# Patient Record
Sex: Female | Born: 2000 | Race: Black or African American | Hispanic: No | Marital: Single | Smoking: Never smoker
Health system: Southern US, Community
[De-identification: ages and names within clinical notes are randomized; demographics above are authoritative.]

## PROBLEM LIST (undated history)

## (undated) DIAGNOSIS — J45909 Unspecified asthma, uncomplicated: Secondary | ICD-10-CM

## (undated) DIAGNOSIS — F32A Depression, unspecified: Secondary | ICD-10-CM

## (undated) DIAGNOSIS — D649 Anemia, unspecified: Secondary | ICD-10-CM

## (undated) DIAGNOSIS — F329 Major depressive disorder, single episode, unspecified: Secondary | ICD-10-CM

## (undated) DIAGNOSIS — F419 Anxiety disorder, unspecified: Secondary | ICD-10-CM

## (undated) HISTORY — PX: MANDIBLE SURGERY: SHX707

## (undated) HISTORY — DX: Depression, unspecified: F32.A

## (undated) HISTORY — PX: NO PAST SURGERIES: SHX2092

## (undated) HISTORY — PX: MYRINGOTOMY: SUR874

## (undated) HISTORY — DX: Anxiety disorder, unspecified: F41.9

---
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
## 2019-01-04 DIAGNOSIS — F6381 Intermittent explosive disorder: Secondary | ICD-10-CM | POA: Diagnosis not present

## 2019-01-11 DIAGNOSIS — F6381 Intermittent explosive disorder: Secondary | ICD-10-CM | POA: Diagnosis not present

## 2019-01-18 DIAGNOSIS — F6381 Intermittent explosive disorder: Secondary | ICD-10-CM | POA: Diagnosis not present

## 2019-01-25 DIAGNOSIS — F6381 Intermittent explosive disorder: Secondary | ICD-10-CM | POA: Diagnosis not present

## 2019-02-01 DIAGNOSIS — F6381 Intermittent explosive disorder: Secondary | ICD-10-CM | POA: Diagnosis not present

## 2019-02-08 DIAGNOSIS — F6381 Intermittent explosive disorder: Secondary | ICD-10-CM | POA: Diagnosis not present

## 2019-02-15 DIAGNOSIS — F6381 Intermittent explosive disorder: Secondary | ICD-10-CM | POA: Diagnosis not present

## 2019-02-22 DIAGNOSIS — F6381 Intermittent explosive disorder: Secondary | ICD-10-CM | POA: Diagnosis not present

## 2019-03-01 DIAGNOSIS — F6381 Intermittent explosive disorder: Secondary | ICD-10-CM | POA: Diagnosis not present

## 2019-03-13 DIAGNOSIS — F6381 Intermittent explosive disorder: Secondary | ICD-10-CM | POA: Diagnosis not present

## 2019-03-20 DIAGNOSIS — F6381 Intermittent explosive disorder: Secondary | ICD-10-CM | POA: Diagnosis not present

## 2019-03-22 ENCOUNTER — Ambulatory Visit (INDEPENDENT_AMBULATORY_CARE_PROVIDER_SITE_OTHER): Payer: Medicaid Other | Admitting: Obstetrics and Gynecology

## 2019-03-22 ENCOUNTER — Encounter: Payer: Self-pay | Admitting: Obstetrics and Gynecology

## 2019-03-22 ENCOUNTER — Other Ambulatory Visit: Payer: Self-pay

## 2019-03-22 VITALS — BP 111/69 | HR 98 | Ht 68.0 in | Wt 200.0 lb

## 2019-03-22 DIAGNOSIS — N926 Irregular menstruation, unspecified: Secondary | ICD-10-CM

## 2019-03-22 DIAGNOSIS — N390 Urinary tract infection, site not specified: Secondary | ICD-10-CM

## 2019-03-22 DIAGNOSIS — Z6830 Body mass index (BMI) 30.0-30.9, adult: Secondary | ICD-10-CM | POA: Diagnosis not present

## 2019-03-22 LAB — POCT URINE PREGNANCY: Preg Test, Ur: POSITIVE — AB

## 2019-03-22 NOTE — Progress Notes (Signed)
  Subjective:     Patient ID: Gabriela Anderson, female   DOB: 10-17-2000, 18 y.o.   MRN: [id number]  HPI Here for pregnancy confirmation. Reports normal LMP 02/04/2019, giving [facility] 11/11/2019 & EGA [redacted]w[redacted]d. Never been pregnant before. H/o frequent UTI/vaginitis (last one in January). sexually active with one female partner.  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  Lives with mother.   Review of Systems  Constitutional: Positive for fatigue.  All other systems reviewed and are negative.      Objective:   Physical Exam A&Ox4 Well groomed female in no distress Blood pressure 111/69, pulse 98, height 5\' 8"  (1.727 m), weight 200 lb (90.7 kg), last menstrual period 02/04/2019. Body mass index is 30.41 kg/m.  UPT +     Assessment:     Missed menses BMI 30 H/O frequent UTI    Plan:     Congratulated on pregnancy and discussed routine prenatal care. PNV samples given. Will send urine culture each visit. Information in check out summary for her to review. RTC in 2 weeks for viability scan and nurse intake/labs, then 5 weeks for new OB PE with me.

## 2019-03-22 NOTE — Patient Instructions (Signed)
First Trimester of Pregnancy The first trimester of pregnancy is from week 1 until the end of week 13 (months 1 through 3). A week after a sperm fertilizes an egg, the egg will implant on the wall of the uterus. This embryo will begin to develop into a baby. Genes from you and your partner will form the baby. The female genes will determine whether the baby will be a boy or a girl. At 6-8 weeks, the eyes and face will be formed, and the heartbeat can be seen on ultrasound. At the end of 12 weeks, all the baby's organs will be formed. Now that you are pregnant, you will want to do everything you can to have a healthy baby. Two of the most important things are to get good prenatal care and to follow your health care provider's instructions. Prenatal care is all the medical care you receive before the baby's birth. This care will help prevent, find, and treat any problems during the pregnancy and childbirth. Body changes during your first trimester Your body goes through many changes during pregnancy. The changes vary from woman to woman.  You may gain or lose a couple of pounds at first.  You may feel sick to your stomach (nauseous) and you may throw up (vomit). If the vomiting is uncontrollable, call your health care provider.  You may tire easily.  You may develop headaches that can be relieved by medicines. All medicines should be approved by your health care provider.  You may urinate more often. Painful urination may mean you have a bladder infection.  You may develop heartburn as a result of your pregnancy.  You may develop constipation because certain hormones are causing the muscles that push stool through your intestines to slow down.  You may develop hemorrhoids or swollen veins (varicose veins).  Your breasts may begin to grow larger and become tender. Your nipples may stick out more, and the tissue that surrounds them (areola) may become darker.  Your gums may bleed and may be  sensitive to brushing and flossing.  Dark spots or blotches (chloasma, mask of pregnancy) may develop on your face. This will likely fade after the baby is born.  Your menstrual periods will stop.  You may have a loss of appetite.  You may develop cravings for certain kinds of food.  You may have changes in your emotions from day to day, such as being excited to be pregnant or being concerned that something may go wrong with the pregnancy and baby.  You may have more vivid and strange dreams.  You may have changes in your hair. These can include thickening of your hair, rapid growth, and changes in texture. Some women also have hair loss during or after pregnancy, or hair that feels dry or thin. Your hair will most likely return to normal after your baby is born. What to expect at prenatal visits During a routine prenatal visit:  You will be weighed to make sure you and the baby are growing normally.  Your blood pressure will be taken.  Your abdomen will be measured to track your baby's growth.  The fetal heartbeat will be listened to between weeks 10 and 14 of your pregnancy.  Test results from any previous visits will be discussed. Your health care provider may ask you:  How you are feeling.  If you are feeling the baby move.  If you have had any abnormal symptoms, such as leaking fluid, bleeding, severe headaches, or abdominal  cramping.  If you are using any tobacco products, including cigarettes, chewing tobacco, and electronic cigarettes.  If you have any questions. Other tests that may be performed during your first trimester include:  Blood tests to find your blood type and to check for the presence of any previous infections. The tests will also be used to check for low iron levels (anemia) and protein on red blood cells (Rh antibodies). Depending on your risk factors, or if you previously had diabetes during pregnancy, you may have tests to check for high blood sugar  that affects pregnant women (gestational diabetes).  Urine tests to check for infections, diabetes, or protein in the urine.  An ultrasound to confirm the proper growth and development of the baby.  Fetal screens for spinal cord problems (spina bifida) and Down syndrome.  HIV (human immunodeficiency virus) testing. Routine prenatal testing includes screening for HIV, unless you choose not to have this test.  You may need other tests to make sure you and the baby are doing well. Follow these instructions at home: Medicines  Follow your health care provider's instructions regarding medicine use. Specific medicines may be either safe or unsafe to take during pregnancy.  Take a prenatal vitamin that contains at least 600 micrograms (mcg) of folic acid.  If you develop constipation, try taking a stool softener if your health care provider approves. Eating and drinking   Eat a balanced diet that includes fresh fruits and vegetables, whole grains, good sources of protein such as meat, eggs, or tofu, and low-fat dairy. Your health care provider will help you determine the amount of weight gain that is right for you.  Avoid raw meat and uncooked cheese. These carry germs that can cause birth defects in the baby.  Eating four or five small meals rather than three large meals a day may help relieve nausea and vomiting. If you start to feel nauseous, eating a few soda crackers can be helpful. Drinking liquids between meals, instead of during meals, also seems to help ease nausea and vomiting.  Limit foods that are high in fat and processed sugars, such as fried and sweet foods.  To prevent constipation: ? Eat foods that are high in fiber, such as fresh fruits and vegetables, whole grains, and beans. ? Drink enough fluid to keep your urine clear or pale yellow. Activity  Exercise only as directed by your health care provider. Most women can continue their usual exercise routine during  pregnancy. Try to exercise for 30 minutes at least 5 days a week. Exercising will help you: ? Control your weight. ? Stay in shape. ? Be prepared for labor and delivery.  Experiencing pain or cramping in the lower abdomen or lower back is a good sign that you should stop exercising. Check with your health care provider before continuing with normal exercises.  Try to avoid standing for long periods of time. Move your legs often if you must stand in one place for a long time.  Avoid heavy lifting.  Wear low-heeled shoes and practice good posture.  You may continue to have sex unless your health care provider tells you not to. Relieving pain and discomfort  Wear a good support bra to relieve breast tenderness.  Take warm sitz baths to soothe any pain or discomfort caused by hemorrhoids. Use hemorrhoid cream if your health care provider approves.  Rest with your legs elevated if you have leg cramps or low back pain.  If you develop varicose veins in  your legs, wear support hose. Elevate your feet for 15 minutes, 3-4 times a day. Limit salt in your diet. Prenatal care  Schedule your prenatal visits by the twelfth week of pregnancy. They are usually scheduled monthly at first, then more often in the last 2 months before delivery.  Write down your questions. Take them to your prenatal visits.  Keep all your prenatal visits as told by your health care provider. This is important. Safety  Wear your seat belt at all times when driving.  Make a list of emergency phone numbers, including numbers for family, friends, the hospital, and police and fire departments. General instructions  Ask your health care provider for a referral to a local prenatal education class. Begin classes no later than the beginning of month 6 of your pregnancy.  Ask for help if you have counseling or nutritional needs during pregnancy. Your health care provider can offer advice or refer you to specialists for help  with various needs.  Do not use hot tubs, steam rooms, or saunas.  Do not douche or use tampons or scented sanitary pads.  Do not cross your legs for long periods of time.  Avoid cat litter boxes and soil used by cats. These carry germs that can cause birth defects in the baby and possibly loss of the fetus by miscarriage or stillbirth.  Avoid all smoking, herbs, alcohol, and medicines not prescribed by your health care provider. Chemicals in these products affect the formation and growth of the baby.  Do not use any products that contain nicotine or tobacco, such as cigarettes and e-cigarettes. If you need help quitting, ask your health care provider. You may receive counseling support and other resources to help you quit.  Schedule a dentist appointment. At home, brush your teeth with a soft toothbrush and be gentle when you floss. Contact a health care provider if:  You have dizziness.  You have mild pelvic cramps, pelvic pressure, or nagging pain in the abdominal area.  You have persistent nausea, vomiting, or diarrhea.  You have a bad smelling vaginal discharge.  You have pain when you urinate.  You notice increased swelling in your face, hands, legs, or ankles.  You are exposed to fifth disease or chickenpox.  You are exposed to [place] measles (rubella) and have never had it. Get help right away if:  You have a fever.  You are leaking fluid from your vagina.  You have spotting or bleeding from your vagina.  You have severe abdominal cramping or pain.  You have rapid weight gain or loss.  You vomit blood or material that looks like coffee grounds.  You develop a severe headache.  You have shortness of breath.  You have any kind of trauma, such as from a fall or a car accident. Summary  The first trimester of pregnancy is from week 1 until the end of week 13 (months 1 through 3).  Your body goes through many changes during pregnancy. The changes vary from  woman to woman.  You will have routine prenatal visits. During those visits, your health care provider will examine you, discuss any test results you may have, and talk with you about how you are feeling. This information is not intended to replace advice given to you by your health care provider. Make sure you discuss any questions you have with your health care provider. Document Released: 06/30/2001 Document Revised: 06/18/2017 Document Reviewed: 06/17/2016 Elsevier Patient Education  2020 [place] for  Pregnant Women While you are pregnant, your body requires additional nutrition to help support your growing baby. You also have a higher need for some vitamins and minerals, such as folic acid, calcium, iron, and vitamin D. Eating a healthy, well-balanced diet is very important for your health and your baby's health. Your need for extra calories varies for the three 45-monthsegments of your pregnancy (trimesters). For most women, it is recommended to consume:  150 extra calories a day during the first trimester.  300 extra calories a day during the second trimester.  300 extra calories a day during the third trimester. What are tips for following this plan?   Do not try to lose weight or go on a diet during pregnancy.  Limit your overall intake of foods that have "empty calories." These are foods that have little nutritional value, such as sweets, desserts, candies, and sugar-sweetened beverages.  Eat a variety of foods (especially fruits and vegetables) to get a full range of vitamins and minerals.  Take a prenatal vitamin to help meet your additional vitamin and mineral needs during pregnancy, specifically for folic acid, iron, calcium, and vitamin D.  Remember to stay active. Ask your health care provider what types of exercise and activities are safe for you.  Practice good food safety and cleanliness. Wash your hands before you eat and after you prepare raw meat.  Wash all fruits and vegetables well before peeling or eating. Taking these actions can help to prevent food-borne illnesses that can be very dangerous to your baby, such as listeriosis. Ask your health care provider for more information about listeriosis. What does 150 extra calories look like? Healthy options that provide 150 extra calories each day could be any of the following:  6-8 oz (170-230 g) of plain low-fat yogurt with  cup of berries.  1 apple with 2 teaspoons (11 g) of peanut butter.  Cut-up vegetables with  cup (60 g) of hummus.  8 oz (230 mL) or 1 cup of low-fat chocolate milk.  1 stick of string cheese with 1 medium orange.  1 peanut butter and jelly sandwich that is made with one slice of whole-wheat bread and 1 tsp (5 g) of peanut butter. For 300 extra calories, you could eat two of those healthy options each day. What is a healthy amount of weight to gain? The right amount of weight gain for you is based on your BMI before you became pregnant. If your BMI:  Was less than 18 (underweight), you should gain 28-40 lb (13-18 kg).  Was 18-24.9 (normal), you should gain 25-35 lb (11-16 kg).  Was 25-29.9 (overweight), you should gain 15-25 lb (7-11 kg).  Was 30 or greater (obese), you should gain 11-20 lb (5-9 kg). What if I am having twins or multiples? Generally, if you are carrying twins or multiples:  You may need to eat 300-600 extra calories a day.  The recommended range for total weight gain is 25-54 lb (11-25 kg), depending on your BMI before pregnancy.  Talk with your health care provider to find out about nutritional needs, weight gain, and exercise that is right for you. What foods can I eat?  Grains All grains. Choose whole grains, such as whole-wheat bread, oatmeal, or brown rice. Vegetables All vegetables. Eat a variety of colors and types of vegetables. Remember to wash your vegetables well before peeling or eating. Fruits All fruits. Eat a variety  of colors and types of fruit. Remember to wash your fruits  well before peeling or eating. Meats and other protein foods Lean meats, including chicken, [place], fish, and lean cuts of beef, veal, or pork. If you eat fish or seafood, choose options that are higher in omega-3 fatty acids and lower in mercury, such as salmon, herring, mussels, trout, sardines, pollock, shrimp, crab, and lobster. Tofu. Tempeh. Beans. Eggs. Peanut butter and other nut butters. Make sure that all meats, poultry, and eggs are cooked to food-safe temperatures or "well-done." Two or more servings of fish are recommended each week in order to get the most benefits from omega-3 fatty acids that are found in seafood. Choose fish that are lower in mercury. You can find more information online:  [place] Dairy Pasteurized milk and milk alternatives (such as almond milk). Pasteurized yogurt and pasteurized cheese. Cottage cheese. Sour cream. Beverages Water. Juices that contain 100% fruit juice or vegetable juice. Caffeine-free teas and decaffeinated coffee. Drinks that contain caffeine are okay to drink, but it is better to avoid caffeine. Keep your total caffeine intake to less than 200 mg each day (which is 12 oz or 355 mL of coffee, tea, or soda) or the limit as told by your health care provider. Fats and oils Fats and oils are okay to include in moderation. Sweets and desserts Sweets and desserts are okay to include in moderation. Seasoning and other foods All pasteurized condiments. The items listed above may not be a complete list of recommended foods and beverages. Contact your dietitian for more options. The items listed above may not be a complete list of foods and beverages [you/your child] can eat. Contact a dietitian for more information. What foods are not recommended? Vegetables Raw (unpasteurized) vegetable juices. Fruits Unpasteurized fruit juices. Meats and other protein foods Lunch meats, bologna, hot  dogs, or other deli meats. (If you must eat those meats, reheat them until they are steaming hot.) Refrigerated pat, meat spreads from a meat counter, smoked seafood that is found in the refrigerated section of a store. Raw or undercooked meats, poultry, and eggs. Raw fish, such as sushi or sashimi. Fish that have high mercury content, such as tilefish, shark, swordfish, and king mackerel. To learn more about mercury in fish, talk with your health care provider or look for online resources, such as:  [employer] Dairy Raw (unpasteurized) milk and any foods that have raw milk in them. Soft cheeses, such as feta, queso blanco, queso fresco, Brie, Camembert cheeses, blue-veined cheeses, and Panela cheese (unless it is made with pasteurized milk, which must be stated on the label). Beverages Alcohol. Sugar-sweetened beverages, such as sodas, teas, or energy drinks. Seasoning and other foods Homemade fermented foods and drinks, such as pickles, sauerkraut, or kombucha drinks. (Store-bought pasteurized versions of these are okay.) Salads that are made in a store or deli, such as ham salad, chicken salad, egg salad, tuna salad, and seafood salad. The items listed above may not be a complete list of foods and beverages to avoid. Contact your dietitian for more information. The items listed above may not be a complete list of foods and beverages [you/your child] should avoid. Contact a dietitian for more information. Where to find more information To calculate the number of calories you need based on your height, weight, and activity level, you can use an online calculator such as:  MobileTransition.ch To calculate how much weight you should gain during pregnancy, you can use an online pregnancy weight gain calculator such as:  StreamingFood.com.cy Summary  While you are pregnant, your  body requires additional nutrition to help support your growing  baby.  Eat a variety of foods, especially fruits and vegetables to get a full range of vitamins and minerals.  Practice good food safety and cleanliness. Wash your hands before you eat and after you prepare raw meat. Wash all fruits and vegetables well before peeling or eating. Taking these actions can help to prevent food-borne illnesses, such as listeriosis, that can be very dangerous to your baby.  Do not eat raw meat or fish. Do not eat fish that have high mercury content, such as tilefish, shark, swordfish, and king mackerel. Do not eat unpasteurized (raw) dairy.  Take a prenatal vitamin to help meet your additional vitamin and mineral needs during pregnancy, specifically for folic acid, iron, calcium, and vitamin D. This information is not intended to replace advice given to you by your health care provider. Make sure you discuss any questions you have with your health care provider. Document Released: 04/20/2014 Document Revised: 10/27/2018 Document Reviewed: 04/02/2017 Elsevier Patient Education  [place]. Common Medications Safe in Pregnancy  Acne:      Constipation:  Benzoyl Peroxide     Colace  Clindamycin      Dulcolax Suppository  [place]ica Erythromycin     Fibercon  Salicylic Acid      Metamucil         Miralax AVOID:        Senakot   Accutane    Cough:  Retin-A       Cough Drops  Tetracycline      Phenergan w/ Codeine if Rx  Minocycline      Robitussin (Plain & DM)  Antibiotics:     Crabs/Lice:  Ceclor       RID  Cephalosporins    AVOID:  E-Mycins      Kwell  Keflex  Macrobid/Macrodantin   Diarrhea:  Penicillin      Kao-Pectate  Zithromax      Imodium AD         PUSH FLUIDS AVOID:       Cipro     Fever:  Tetracycline      Tylenol (Regular or Extra  Minocycline       Strength)  Levaquin      Extra Strength-Do not          Exceed 8 tabs/24 hrs Caffeine:        '200mg'$ /day (equiv. To 1 cup of coffee or  approx. 3 12 oz  sodas)         Gas: Cold/Hayfever:       Gas-X  Benadryl      Mylicon  Claritin       Phazyme  **Claritin-D        Chlor-Trimeton    Headaches:  Dimetapp      ASA-Free Excedrin  Drixoral-Non-Drowsy     Cold Compress  Mucinex (Guaifenasin)     Tylenol (Regular or Extra  Sudafed/Sudafed-12 Hour     Strength)  **Sudafed PE Pseudoephedrine   Tylenol Cold & Sinus     Vicks Vapor Rub  Zyrtec  **AVOID if Problems With Blood Pressure         Heartburn: Avoid lying down for at least 1 hour after meals  Aciphex      Maalox     Rash:  Milk of Magnesia     Benadryl    Mylanta       1% Hydrocortisone Cream  Pepcid  Pepcid Complete   Sleep  Aids:  Prevacid      Ambien   Prilosec       Benadryl  Rolaids       Chamomile Tea  Tums (Limit 4/day)     Unisom  Zantac       Tylenol PM         Warm milk-add vanilla or  Hemorrhoids:       Sugar for taste  Anusol/Anusol H.C.  (RX: Analapram 2.5%)  Sugar Substitutes:  Hydrocortisone OTC     Ok in moderation  Preparation H      Tucks        Vaseline lotion applied to tissue with wiping    Herpes:     Throat:  Acyclovir      Oragel  Famvir  Valtrex     Vaccines:         Flu Shot Leg Cramps:       *Gardasil  Benadryl      Hepatitis A         Hepatitis B Nasal Spray:       Pneumovax  Saline Nasal Spray     Polio Booster         Tetanus Nausea:       Tuberculosis test or PPD  Vitamin B6 25 mg TID   AVOID:    Dramamine      *Gardasil  Emetrol       Live Poliovirus  Ginger Root 250 mg QID    MMR (measles, mumps &  High Complex Carbs @ Bedtime    rebella)  Sea Bands-Accupressure    Varicella (Chickenpox)  Unisom 1/2 tab TID     *No known complications           If received before Pain:         Known pregnancy;   Darvocet       Resume series after  Lortab        Delivery  Percocet    Yeast:   Tramadol      Femstat  Tylenol 3      Gyne-lotrimin  Ultram       Monistat  Vicodin           MISC:         All  Sunscreens           Hair Coloring/highlights          Insect Repellant's          (Including DEET)         Mystic Tans Commonly Asked Questions During Pregnancy  Cats: A parasite can be excreted in cat feces.  To avoid exposure you need to have another person empty the little box.  If you must empty the litter box you will need to wear gloves.  Wash your hands after handling your cat.  This parasite can also be found in raw or undercooked meat so this should also be avoided.  Colds, Sore Throats, Flu: Please check your medication sheet to see what you can take for symptoms.  If your symptoms are unrelieved by these medications please call the office.  Dental Work: Most any dental work Investment banker, corporate recommends is permitted.  X-rays should only be taken during the first trimester if absolutely necessary.  Your abdomen should be shielded with a lead apron during all x-rays.  Please notify your provider prior to receiving any x-rays.  Novocaine is fine; gas is not recommended.  If your dentist requires a note from  [place] prior to dental work please call the office and we will provide one for you.  Exercise: Exercise is an important part of staying healthy during your pregnancy.  You may continue most exercises you were accustomed to prior to pregnancy.  Later in your pregnancy you will most likely notice you have difficulty with activities requiring balance like riding a bicycle.  It is important that you listen to your body and avoid activities that put you at a higher risk of falling.  Adequate rest and staying well hydrated are a must!  If you have questions about the safety of specific activities ask your provider.    Exposure to Children with illness: Try to avoid obvious exposure; report any symptoms to [place] when noted,  If you have chicken pos, red measles or mumps, you should be immune to these diseases.   Please do not take any vaccines while pregnant unless you have checked with your OB  provider.  Fetal Movement: After 28 weeks we recommend you do "kick counts" twice daily.  Lie or sit down in a calm quiet environment and count your baby movements "kicks".  You should feel your baby at least 10 times per hour.  If you have not felt 10 kicks within the first hour get up, walk around and have something sweet to eat or drink then repeat for an additional hour.  If count remains less than 10 per hour notify your provider.  Fumigating: Follow your pest control agent's advice as to how long to stay out of your home.  Ventilate the area well before re-entering.  Hemorrhoids:   Most over-the-counter preparations can be used during pregnancy.  Check your medication to see what is safe to use.  It is important to use a stool softener or fiber in your diet and to drink lots of liquids.  If hemorrhoids seem to be getting worse please call the office.   Hot Tubs:  Hot tubs Jacuzzis and saunas are not recommended while pregnant.  These increase your internal body temperature and should be avoided.  Intercourse:  Sexual intercourse is safe during pregnancy as long as you are comfortable, unless otherwise advised by your provider.  Spotting may occur after intercourse; report any bright red bleeding that is heavier than spotting.  Labor:  If you know that you are in labor, please go to the hospital.  If you are unsure, please call the office and let us help you decide what to do.  Lifting, straining, etc:  If your job requires heavy lifting or straining please check with your provider for any limitations.  Generally, you should not lift items heavier than that you can lift simply with your hands and arms (no back muscles)  Painting:  Paint fumes do not harm your pregnancy, but may make you ill and should be avoided if possible.  Latex or water based paints have less odor than oils.  Use adequate ventilation while painting.  Permanents & Hair Color:  Chemicals in hair dyes are not recommended as  they cause increase hair dryness which can increase hair loss during pregnancy.  " Highlighting" and permanents are allowed.  Dye may be absorbed differently and permanents may not hold as well during pregnancy.  Sunbathing:  Use a sunscreen, as skin burns easily during pregnancy.  Drink plenty of fluids; avoid over heating.  Tanning Beds:  Because their possible side effects are still unknown, tanning beds are not recommended.  Ultrasound Scans:  Routine ultrasounds are  performed at approximately 20 weeks.  You will be able to see your baby's general anatomy an if you would like to know the gender this can usually be determined as well.  If it is questionable when you conceived you may also receive an ultrasound early in your pregnancy for dating purposes.  Otherwise ultrasound exams are not routinely performed unless there is a medical necessity.  Although you can request a scan we ask that you pay for it when conducted because insurance does not cover " patient request" scans.  Work: If your pregnancy proceeds without complications you may work until your due date, unless your physician or employer advises otherwise.  Round Ligament Pain/Pelvic Discomfort:  Sharp, shooting pains not associated with bleeding are fairly common, usually occurring in the second trimester of pregnancy.  They tend to be worse when standing up or when you remain standing for long periods of time.  These are the result of pressure of certain pelvic ligaments called "round ligaments".  Rest, Tylenol and heat seem to be the most effective relief.  As the womb and fetus grow, they rise out of the pelvis and the discomfort improves.  Please notify the office if your pain seems different than that described.  It may represent a more serious condition.

## 2019-03-24 ENCOUNTER — Other Ambulatory Visit: Payer: Self-pay | Admitting: Obstetrics and Gynecology

## 2019-03-24 LAB — URINE CULTURE

## 2019-03-24 MED ORDER — AMOXICILLIN 500 MG PO CAPS: 500.0000 mg | ORAL_CAPSULE | Freq: Three times a day (TID) | ORAL | 2 refills | Status: DC

## 2019-03-28 DIAGNOSIS — F6381 Intermittent explosive disorder: Secondary | ICD-10-CM | POA: Diagnosis not present

## 2019-04-04 DIAGNOSIS — F6381 Intermittent explosive disorder: Secondary | ICD-10-CM | POA: Diagnosis not present

## 2019-04-05 ENCOUNTER — Telehealth: Payer: Self-pay | Admitting: Obstetrics and Gynecology

## 2019-04-05 ENCOUNTER — Encounter: Payer: Self-pay | Admitting: *Deleted

## 2019-04-05 ENCOUNTER — Other Ambulatory Visit: Payer: Self-pay | Admitting: *Deleted

## 2019-04-05 MED ORDER — FLUCONAZOLE 150 MG PO TABS: 150.0000 mg | ORAL_TABLET | Freq: Once | ORAL | 1 refills | Status: AC

## 2019-04-05 NOTE — Telephone Encounter (Signed)
The patient called and stated that her prescription for a yeast infection was never sent into her pharmacy [facility] in [place]. Pt is requesting a call back. Please advise.

## 2019-04-05 NOTE — Telephone Encounter (Signed)
Sent in pts [id number]

## 2019-04-05 NOTE — Telephone Encounter (Signed)
Patient called stating she is having vaginal problems and would like to speak with someone. Thanks

## 2019-04-06 ENCOUNTER — Ambulatory Visit (INDEPENDENT_AMBULATORY_CARE_PROVIDER_SITE_OTHER): Payer: Medicaid Other

## 2019-04-06 ENCOUNTER — Ambulatory Visit (INDEPENDENT_AMBULATORY_CARE_PROVIDER_SITE_OTHER): Payer: Medicaid Other | Admitting: Obstetrics and Gynecology

## 2019-04-06 ENCOUNTER — Other Ambulatory Visit: Payer: Self-pay

## 2019-04-06 VITALS — BP 133/85 | HR 74 | Ht 68.0 in | Wt 196.4 lb

## 2019-04-06 DIAGNOSIS — Z113 Encounter for screening for infections with a predominantly sexual mode of transmission: Secondary | ICD-10-CM | POA: Diagnosis not present

## 2019-04-06 DIAGNOSIS — Z3401 Encounter for supervision of normal first pregnancy, first trimester: Secondary | ICD-10-CM | POA: Diagnosis not present

## 2019-04-06 DIAGNOSIS — N926 Irregular menstruation, unspecified: Secondary | ICD-10-CM

## 2019-04-06 DIAGNOSIS — O3481 Maternal care for other abnormalities of pelvic organs, first trimester: Secondary | ICD-10-CM

## 2019-04-06 DIAGNOSIS — Z0283 Encounter for blood-alcohol and blood-drug test: Secondary | ICD-10-CM

## 2019-04-06 DIAGNOSIS — Z3A08 8 weeks gestation of pregnancy: Secondary | ICD-10-CM

## 2019-04-06 DIAGNOSIS — R638 Other symptoms and signs concerning food and fluid intake: Secondary | ICD-10-CM

## 2019-04-06 DIAGNOSIS — N8311 Corpus luteum cyst of right ovary: Secondary | ICD-10-CM

## 2019-04-06 NOTE — Patient Instructions (Signed)
WHAT OB PATIENTS CAN EXPECT   Confirmation of pregnancy and ultrasound ordered if medically indicated-[redacted] weeks gestation  New OB (NOB) intake with nurse and New OB (NOB) labs- [redacted] weeks gestation  New OB (NOB) physical examination with provider- 11/[redacted] weeks gestation  Flu vaccine-[redacted] weeks gestation  Anatomy scan-[redacted] weeks gestation  Glucose tolerance test, blood work to test for anemia, T-dap vaccine-[redacted] weeks gestation  Vaginal swabs/cultures-STD/Group B strep-[redacted] weeks gestation  Appointments every 4 weeks until 28 weeks  Every 2 weeks from 28 weeks until 36 weeks  Weekly visits from 36 weeks until delivery  Morning Sickness  Morning sickness is when you feel sick to your stomach (nauseous) during pregnancy. You may feel sick to your stomach and throw up (vomit). You may feel sick in the morning, but you can feel this way at any time of day. Some women feel very sick to their stomach and cannot stop throwing up (hyperemesis gravidarum). Follow these instructions at home: Medicines  Take over-the-counter and prescription medicines only as told by your doctor. Do not take any medicines until you talk with your doctor about them first.  Taking multivitamins before getting pregnant can stop or lessen the harshness of morning sickness. Eating and drinking  Eat dry toast or crackers before getting out of bed.  Eat 5 or 6 small meals a day.  Eat dry and bland foods like rice and baked potatoes.  Do not eat greasy, fatty, or spicy foods.  Have someone cook for you if the smell of food causes you to feel sick or throw up.  If you feel sick to your stomach after taking prenatal vitamins, take them at night or with a snack.  Eat protein when you need a snack. Nuts, yogurt, and cheese are good choices.  Drink fluids throughout the day.  Try ginger ale made with real ginger, ginger tea made from fresh grated ginger, or ginger candies. General instructions  Do not use any products  that have nicotine or tobacco in them, such as cigarettes and e-cigarettes. If you need help quitting, ask your doctor.  Use an air purifier to keep the air in your house free of smells.  Get lots of fresh air.  Try to avoid smells that make you feel sick.  Try: ? Wearing a bracelet that is used for seasickness (acupressure wristband). ? Going to a doctor who puts thin needles into certain body points (acupuncture) to improve how you feel. Contact a doctor if:  You need medicine to feel better.  You feel dizzy or light-headed.  You are losing weight. Get help right away if:  You feel very sick to your stomach and cannot stop throwing up.  You pass out (faint).  You have very bad pain in your belly. Summary  Morning sickness is when you feel sick to your stomach (nauseous) during pregnancy.  You may feel sick in the morning, but you can feel this way at any time of day.  Making some changes to what you eat may help your symptoms go away. This information is not intended to replace advice given to you by your health care provider. Make sure you discuss any questions you have with your health care provider. Document Released: 08/13/2004 Document Revised: 06/18/2017 Document Reviewed: 08/06/2016 Elsevier Patient Education  2020 [employer]. Prenatal Care Prenatal care is health care during pregnancy. It helps you and your unborn baby (fetus) stay as healthy as possible. Prenatal care may be provided by a midwife, a  family practice health care provider, or a childbirth and pregnancy specialist (obstetrician). How does this affect me? During pregnancy, you will be closely monitored for any new conditions that might develop. To lower your risk of pregnancy complications, you and your health care provider will talk about any underlying conditions you have. How does this affect my baby? Early and consistent prenatal care increases the chance that your baby will be healthy during  pregnancy. Prenatal care lowers the risk that your baby will be:  Born early (prematurely).  Smaller than expected at birth (small for gestational age). What can I expect at the first prenatal care visit? Your first prenatal care visit will likely be the longest. You should schedule your first prenatal care visit as soon as you know that you are pregnant. Your first visit is a good time to talk about any questions or concerns you have about pregnancy. At your visit, you and your health care provider will talk about:  Your medical history, including: ? Any past pregnancies. ? Your family's medical history. ? The baby's father's medical history. ? Any long-term (chronic) health conditions you have and how you manage them. ? Any surgeries or procedures you have had. ? Any current over-the-counter or prescription medicines, herbs, or supplements you are taking.  Other factors that could pose a risk to your baby, including:  Your home setting and your stress levels, including: ? Exposure to abuse or violence. ? Household financial strain. ? Mental health conditions you have.  Your daily health habits, including diet and exercise. Your health care provider will also:  Measure your weight, height, and blood pressure.  Do a physical exam, including a pelvic and breast exam.  Perform blood tests and urine tests to check for: ? Urinary tract infection. ? Sexually transmitted infections (STIs). ? Low iron levels in your blood (anemia). ? Blood type and certain proteins on red blood cells (Rh antibodies). ? Infections and immunity to viruses, such as hepatitis B and rubella. ? HIV (human immunodeficiency virus).  Do an ultrasound to confirm your baby's growth and development and to help predict your estimated due date (EDD). This ultrasound is done with a probe that is inserted into the vagina (transvaginal ultrasound).  Discuss your options for genetic screening.  Give you information  about how to keep yourself and your baby healthy, including: ? Nutrition and taking vitamins. ? Physical activity. ? How to manage pregnancy symptoms such as nausea and vomiting (morning sickness). ? Infections and substances that may be harmful to your baby and how to avoid them. ? Food safety. ? Dental care. ? Working. ? Travel. ? Warning signs to watch for and when to call your health care provider. How often will I have prenatal care visits? After your first prenatal care visit, you will have regular visits throughout your pregnancy. The visit schedule is often as follows:  Up to week 28 of pregnancy: once every 4 weeks.  28-36 weeks: once every 2 weeks.  After 36 weeks: every week until delivery. Some women may have visits more or less often depending on any underlying health conditions and the health of the baby. Keep all follow-up and prenatal care visits as told by your health care provider. This is important. What happens during routine prenatal care visits? Your health care provider will:  Measure your weight and blood pressure.  Check for fetal heart sounds.  Measure the height of your uterus in your abdomen (fundal height). This may be measured starting  around week 20 of pregnancy.  Check the position of your baby inside your uterus.  Ask questions about your diet, sleeping patterns, and whether you can feel the baby move.  Review warning signs to watch for and signs of labor.  Ask about any pregnancy symptoms you are having and how you are dealing with them. Symptoms may include: ? Headaches. ? Nausea and vomiting. ? Vaginal discharge. ? Swelling. ? Fatigue. ? Constipation. ? Any discomfort, including back or pelvic pain. Make a list of questions to ask your health care provider at your routine visits. What tests might I have during prenatal care visits? You may have blood, urine, and imaging tests throughout your pregnancy, such as:  Urine tests to check  for glucose, protein, or signs of infection.  Glucose tests to check for a form of diabetes that can develop during pregnancy (gestational diabetes mellitus). This is usually done around week 24 of pregnancy.  An ultrasound to check your baby's growth and development and to check for birth defects. This is usually done around week 20 of pregnancy.  A test to check for group B strep (GBS) infection. This is usually done around week 36 of pregnancy.  Genetic testing. This may include blood or imaging tests, such as an ultrasound. Some genetic tests are done during the first trimester and some are done during the second trimester. What else can I expect during prenatal care visits? Your health care provider may recommend getting certain vaccines during pregnancy. These may include:  A yearly flu shot (annual influenza vaccine). This is especially important if you will be pregnant during flu season.  Tdap (tetanus, diphtheria, pertussis) vaccine. Getting this vaccine during pregnancy can protect your baby from whooping cough (pertussis) after birth. This vaccine may be recommended between weeks 27 and 36 of pregnancy. Later in your pregnancy, your health care provider may give you information about:  Childbirth and breastfeeding classes.  Choosing a health care provider for your baby.  Umbilical cord banking.  Breastfeeding.  Birth control after your baby is born.  The hospital labor and delivery unit and how to tour it.  Registering at the hospital before you go into labor. Where to find more information  Office on Women's Health: LegalWarrants.gl  American Pregnancy Association: americanpregnancy.org  March of Dimes: marchofdimes.org Summary  Prenatal care helps you and your baby stay as healthy as possible during pregnancy.  Your first prenatal care visit will most likely be the longest.  You will have visits and tests throughout your pregnancy to monitor your health and  your baby's health.  Bring a list of questions to your visits to ask your health care provider.  Make sure to keep all follow-up and prenatal care visits with your health care provider. This information is not intended to replace advice given to you by your health care provider. Make sure you discuss any questions you have with your health care provider. Document Released: 07/09/2003 Document Revised: 10/26/2018 Document Reviewed: 07/05/2017 Elsevier Patient Education  2020 [employer]. How a Baby Grows During Pregnancy  Pregnancy begins when a female's sperm enters a female's egg (fertilization). Fertilization usually happens in one of the tubes (fallopian tubes) that connect the ovaries to the womb (uterus). The fertilized egg moves down the fallopian tube to the uterus. Once it reaches the uterus, it implants into the lining of the uterus and begins to grow. For the first 10 weeks, the fertilized egg is called an embryo. After 10 weeks, it  is called a fetus. As the fetus continues to grow, it receives oxygen and nutrients through tissue (placenta) that grows to support the developing baby. The placenta is the life support system for the baby. It provides oxygen and nutrition and removes waste. Learning as much as you can about your pregnancy and how your baby is developing can help you enjoy the experience. It can also make you aware of when there might be a problem and when to ask questions. How long does a typical pregnancy last? A pregnancy usually lasts 280 days, or about 40 weeks. Pregnancy is divided into three periods of growth, also called trimesters:  First trimester: 0-12 weeks.  Second trimester: 13-27 weeks.  Third trimester: 28-40 weeks. The day when your baby is ready to be born (full term) is your estimated date of delivery. How does my baby develop month by month? First month  The fertilized egg attaches to the inside of the uterus.  Some cells will form the placenta.  Others will form the fetus.  The arms, legs, brain, spinal cord, lungs, and heart begin to develop.  At the end of the first month, the heart begins to beat. Second month  The bones, inner ear, eyelids, hands, and feet form.  The genitals develop.  By the end of 8 weeks, all major organs are developing. Third month  All of the internal organs are forming.  Teeth develop below the gums.  Bones and muscles begin to grow. The spine can flex.  The skin is transparent.  Fingernails and toenails begin to form.  Arms and legs continue to grow longer, and hands and feet develop.  The fetus is about 3 inches (7.6 cm) long. Fourth month  The placenta is completely formed.  The external sex organs, neck, outer ear, eyebrows, eyelids, and fingernails are formed.  The fetus can hear, swallow, and move its arms and legs.  The kidneys begin to produce urine.  The skin is covered with a white, waxy coating (vernix) and very fine hair (lanugo). Fifth month  The fetus moves around more and can be felt for the first time (quickening).  The fetus starts to sleep and wake up and may begin to suck its finger.  The nails grow to the end of the fingers.  The organ in the digestive system that makes bile (gallbladder) functions and helps to digest nutrients.  If your baby is a girl, eggs are present in her ovaries. If your baby is a boy, testicles start to move down into his scrotum. Sixth month  The lungs are formed.  The eyes open. The brain continues to develop.  Your baby has fingerprints and toe prints. Your baby's hair grows thicker.  At the end of the second trimester, the fetus is about 9 inches (22.9 cm) long. Seventh month  The fetus kicks and stretches.  The eyes are developed enough to sense changes in light.  The hands can make a grasping motion.  The fetus responds to sound. Eighth month  All organs and body systems are fully developed and  functioning.  Bones harden, and taste buds develop. The fetus may hiccup.  Certain areas of the brain are still developing. The skull remains soft. Ninth month  The fetus gains about  lb (0.23 kg) each week.  The lungs are fully developed.  Patterns of sleep develop.  The fetus's head typically moves into a head-down position (vertex) in the uterus to prepare for birth.  The fetus weighs  6-9 lb (2.72-4.08 kg) and is 19-20 inches (48.26-50.8 cm) long. What can I do to have a healthy pregnancy and help my baby develop? General instructions  Take prenatal vitamins as directed by your health care provider. These include vitamins such as folic acid, iron, calcium, and vitamin D. They are important for healthy development.  Take medicines only as directed by your health care provider. Read labels and ask a pharmacist or your health care provider whether over-the-counter medicines, supplements, and prescription drugs are safe to take during pregnancy.  Keep all follow-up visits as directed by your health care provider. This is important. Follow-up visits include prenatal care and screening tests. How do I know if my baby is developing well? At each prenatal visit, your health care provider will do several different tests to check on your health and keep track of your baby's development. These include:  Fundal height and position. ? Your health care provider will measure your growing belly from your pubic bone to the top of the uterus using a tape measure. ? Your health care provider will also feel your belly to determine your baby's position.  Heartbeat. ? An ultrasound in the first trimester can confirm pregnancy and show a heartbeat, depending on how far along you are. ? Your health care provider will check your baby's heart rate at every prenatal visit.  Second trimester ultrasound. ? This ultrasound checks your baby's development. It also may show your baby's gender. What should I  do if I have concerns about my baby's development? Always talk with your health care provider about any concerns that you may have about your pregnancy and your baby. Summary  A pregnancy usually lasts 280 days, or about 40 weeks. Pregnancy is divided into three periods of growth, also called trimesters.  Your health care provider will monitor your baby's growth and development throughout your pregnancy.  Follow your health care provider's recommendations about taking prenatal vitamins and medicines during your pregnancy.  Talk with your health care provider if you have any concerns about your pregnancy or your developing baby. This information is not intended to replace advice given to you by your health care provider. Make sure you discuss any questions you have with your health care provider. Document Released: 12/23/2007 Document Revised: 10/27/2018 Document Reviewed: 05/19/2017 Elsevier Patient Education  2020 [place] of Pregnancy  The first trimester of pregnancy is from week 1 until the end of week 13 (months 1 through 3). During this time, your baby will begin to develop inside you. At 6-8 weeks, the eyes and face are formed, and the heartbeat can be seen on ultrasound. At the end of 12 weeks, all the baby's organs are formed. Prenatal care is all the medical care you receive before the birth of your baby. Make sure you get good prenatal care and follow all of your doctor's instructions. Follow these instructions at home: Medicines  Take over-the-counter and prescription medicines only as told by your doctor. Some medicines are safe and some medicines are not safe during pregnancy.  Take a prenatal vitamin that contains at least 600 micrograms (mcg) of folic acid.  If you have trouble pooping (constipation), take medicine that will make your stool soft (stool softener) if your doctor approves. Eating and drinking   Eat regular, healthy meals.  Your doctor  will tell you the amount of weight gain that is right for you.  Avoid raw meat and uncooked cheese.  If you feel sick to  your stomach (nauseous) or throw up (vomit): ? Eat 4 or 5 small meals a day instead of 3 large meals. ? Try eating a few soda crackers. ? Drink liquids between meals instead of during meals.  To prevent constipation: ? Eat foods that are high in fiber, like fresh fruits and vegetables, whole grains, and beans. ? Drink enough fluids to keep your pee (urine) clear or pale yellow. Activity  Exercise only as told by your doctor. Stop exercising if you have cramps or pain in your lower belly (abdomen) or low back.  Do not exercise if it is too hot, too humid, or if you are in a place of great height (high altitude).  Try to avoid standing for long periods of time. Move your legs often if you must stand in one place for a long time.  Avoid heavy lifting.  Wear low-heeled shoes. Sit and stand up straight.  You can have sex unless your doctor tells you not to. Relieving pain and discomfort  Wear a good support bra if your breasts are sore.  Take warm water baths (sitz baths) to soothe pain or discomfort caused by hemorrhoids. Use hemorrhoid cream if your doctor says it is okay.  Rest with your legs raised if you have leg cramps or low back pain.  If you have puffy, bulging veins (varicose veins) in your legs: ? Wear support hose or compression stockings as told by your doctor. ? Raise (elevate) your feet for 15 minutes, 3-4 times a day. ? Limit salt in your food. Prenatal care  Schedule your prenatal visits by the twelfth week of pregnancy.  Write down your questions. Take them to your prenatal visits.  Keep all your prenatal visits as told by your doctor. This is important. Safety  Wear your seat belt at all times when driving.  Make a list of emergency phone numbers. The list should include numbers for family, friends, the hospital, and police and fire  departments. General instructions  Ask your doctor for a referral to a local prenatal class. Begin classes no later than at the start of month 6 of your pregnancy.  Ask for help if you need counseling or if you need help with nutrition. Your doctor can give you advice or tell you where to go for help.  Do not use hot tubs, steam rooms, or saunas.  Do not douche or use tampons or scented sanitary pads.  Do not cross your legs for long periods of time.  Avoid all herbs and alcohol. Avoid drugs that are not approved by your doctor.  Do not use any tobacco products, including cigarettes, chewing tobacco, and electronic cigarettes. If you need help quitting, ask your doctor. You may get counseling or other support to help you quit.  Avoid cat litter boxes and soil used by cats. These carry germs that can cause birth defects in the baby and can cause a loss of your baby (miscarriage) or stillbirth.  Visit your dentist. At home, brush your teeth with a soft toothbrush. Be gentle when you floss. Contact a doctor if:  You are dizzy.  You have mild cramps or pressure in your lower belly.  You have a nagging pain in your belly area.  You continue to feel sick to your stomach, you throw up, or you have watery poop (diarrhea).  You have a bad smelling fluid coming from your vagina.  You have pain when you pee (urinate).  You have increased puffiness (swelling) in your  face, hands, legs, or ankles. Get help right away if:  You have a fever.  You are leaking fluid from your vagina.  You have spotting or bleeding from your vagina.  You have very bad belly cramping or pain.  You gain or lose weight rapidly.  You throw up blood. It may look like coffee grounds.  You are around people who have [place] measles, fifth disease, or chickenpox.  You have a very bad headache.  You have shortness of breath.  You have any kind of trauma, such as from a fall or a car  accident. Summary  The first trimester of pregnancy is from week 1 until the end of week 13 (months 1 through 3).  To take care of yourself and your unborn baby, you will need to eat healthy meals, take medicines only if your doctor tells you to do so, and do activities that are safe for you and your baby.  Keep all follow-up visits as told by your doctor. This is important as your doctor will have to ensure that your baby is healthy and growing well. This information is not intended to replace advice given to you by your health care provider. Make sure you discuss any questions you have with your health care provider. Document Released: 12/23/2007 Document Revised: 10/27/2018 Document Reviewed: 07/14/2016 Elsevier Patient Education  2020 [employer]. Commonly Asked Questions During Pregnancy  Cats: A parasite can be excreted in cat feces.  To avoid exposure you need to have another person empty the little box.  If you must empty the litter box you will need to wear gloves.  Wash your hands after handling your cat.  This parasite can also be found in raw or undercooked meat so this should also be avoided.  Colds, Sore Throats, Flu: Please check your medication sheet to see what you can take for symptoms.  If your symptoms are unrelieved by these medications please call the office.  Dental Work: Most any dental work [employer] recommends is permitted.  X-rays should only be taken during the first trimester if absolutely necessary.  Your abdomen should be shielded with a lead apron during all x-rays.  Please notify your provider prior to receiving any x-rays.  Novocaine is fine; gas is not recommended.  If your dentist requires a note from [place] prior to dental work please call the office and we will provide one for you.  Exercise: Exercise is an important part of staying healthy during your pregnancy.  You may continue most exercises you were accustomed to prior to pregnancy.  Later in your  pregnancy you will most likely notice you have difficulty with activities requiring balance like riding a bicycle.  It is important that you listen to your body and avoid activities that put you at a higher risk of falling.  Adequate rest and staying well hydrated are a must!  If you have questions about the safety of specific activities ask your provider.    Exposure to Children with illness: Try to avoid obvious exposure; report any symptoms to [place] when noted,  If you have chicken pos, red measles or mumps, you should be immune to these diseases.   Please do not take any vaccines while pregnant unless you have checked with your OB provider.  Fetal Movement: After 28 weeks we recommend you do "kick counts" twice daily.  Lie or sit down in a calm quiet environment and count your baby movements "kicks".  You should feel your baby  should feel your baby at least 10 times per hour.  If you have not felt 10 kicks within the first hour get up, walk around and have something sweet to eat or drink then repeat for an additional hour.  If count remains less than 10 per hour notify your provider.  Fumigating: Follow your pest control agent's advice as to how long to stay out of your home.  Ventilate the area well before re-entering.  Hemorrhoids:   Most over-the-counter preparations can be used during pregnancy.  Check your medication to see what is safe to use.  It is important to use a stool softener or fiber in your diet and to drink lots of liquids.  If hemorrhoids seem to be getting worse please call the office.   Hot Tubs:  Hot tubs Jacuzzis and saunas are not recommended while pregnant.  These increase your internal body temperature and should be avoided.  Intercourse:  Sexual intercourse is safe during pregnancy as long as you are comfortable, unless otherwise advised by your provider.  Spotting may occur after intercourse; report any bright red bleeding that is heavier than spotting.  Labor:  If you know that you are in labor,  please go to the hospital.  If you are unsure, please call the office and let us help you decide what to do.  Lifting, straining, etc:  If your job requires heavy lifting or straining please check with your provider for any limitations.  Generally, you should not lift items heavier than that you can lift simply with your hands and arms (no back muscles)  Painting:  Paint fumes do not harm your pregnancy, but may make you ill and should be avoided if possible.  Latex or water based paints have less odor than oils.  Use adequate ventilation while painting.  Permanents & Hair Color:  Chemicals in hair dyes are not recommended as they cause increase hair dryness which can increase hair loss during pregnancy.  " Highlighting" and permanents are allowed.  Dye may be absorbed differently and permanents may not hold as well during pregnancy.  Sunbathing:  Use a sunscreen, as skin burns easily during pregnancy.  Drink plenty of fluids; avoid over heating.  Tanning Beds:  Because their possible side effects are still unknown, tanning beds are not recommended.  Ultrasound Scans:  Routine ultrasounds are performed at approximately 20 weeks.  You will be able to see your baby's general anatomy an if you would like to know the gender this can usually be determined as well.  If it is questionable when you conceived you may also receive an ultrasound early in your pregnancy for dating purposes.  Otherwise ultrasound exams are not routinely performed unless there is a medical necessity.  Although you can request a scan we ask that you pay for it when conducted because insurance does not cover " patient request" scans.  Work: If your pregnancy proceeds without complications you may work until your due date, unless your physician or employer advises otherwise.  Round Ligament Pain/Pelvic Discomfort:  Sharp, shooting pains not associated with bleeding are fairly common, usually occurring in the second trimester of  pregnancy.  They tend to be worse when standing up or when you remain standing for long periods of time.  These are the result of pressure of certain pelvic ligaments called "round ligaments".  Rest, Tylenol and heat seem to be the most effective relief.  As the womb and fetus grow, they rise out of the pelvis   Please notify the office if your pain seems different than that described.  It may represent a more serious condition.  Common Medications Safe in Pregnancy  Acne:      Constipation:  Benzoyl Peroxide     Colace  Clindamycin      Dulcolax Suppository  Topica Erythromycin     Fibercon  Salicylic Acid      Metamucil         Miralax AVOID:        Senakot   Accutane    Cough:  Retin-A       Cough Drops  Tetracycline      Phenergan w/ Codeine if Rx  Minocycline      Robitussin (Plain & DM)  Antibiotics:     Crabs/Lice:  Ceclor       RID  Cephalosporins    AVOID:  E-Mycins      Kwell  Keflex  Macrobid/Macrodantin   Diarrhea:  Penicillin      Kao-Pectate  Zithromax      Imodium AD         PUSH FLUIDS AVOID:       Cipro     Fever:  Tetracycline      Tylenol (Regular or Extra  Minocycline       Strength)  Levaquin      Extra Strength-Do not          Exceed 8 tabs/24 hrs Caffeine:        <258m/day (equiv. To 1 cup of coffee or  approx. 3 12 oz sodas)         Gas: Cold/Hayfever:       Gas-X  Benadryl      Mylicon  Claritin       Phazyme  **Claritin-D        Chlor-Trimeton    Headaches:  Dimetapp      ASA-Free Excedrin  Drixoral-Non-Drowsy     Cold Compress  Mucinex (Guaifenasin)     Tylenol (Regular or Extra  Sudafed/Sudafed-12 Hour     Strength)  **Sudafed PE Pseudoephedrine   Tylenol Cold & Sinus     Vicks Vapor Rub  Zyrtec  **AVOID if Problems With Blood Pressure         Heartburn: Avoid lying down for at least 1 hour after meals  Aciphex      Maalox     Rash:  Milk of Magnesia     Benadryl    Mylanta       1% Hydrocortisone  Cream  Pepcid  Pepcid Complete   Sleep Aids:  Prevacid      Ambien   Prilosec       Benadryl  Rolaids       Chamomile Tea  Tums (Limit 4/day)     Unisom  Zantac       Tylenol PM         Warm milk-add vanilla or  Hemorrhoids:       Sugar for taste  Anusol/Anusol H.C.  (RX: Analapram 2.5%)  Sugar Substitutes:  Hydrocortisone OTC     Ok in moderation  Preparation H      Tucks        Vaseline lotion applied to tissue with wiping    Herpes:     Throat:  Acyclovir      Oragel  Famvir  Valtrex     Vaccines:         Flu Shot Leg Cramps:       *Gardasil  Benadryl      Hepatitis A         Hepatitis B Nasal Spray:       Pneumovax  Saline Nasal Spray     Polio Booster         Tetanus Nausea:       Tuberculosis test or PPD  Vitamin B6 25 mg TID   AVOID:    Dramamine      *Gardasil  Emetrol       Live Poliovirus  Ginger Root 250 mg QID    MMR (measles, mumps &  High Complex Carbs @ Bedtime    rebella)  Sea Bands-Accupressure    Varicella (Chickenpox)  Unisom 1/2 tab TID     *No known complications           If received before Pain:         Known pregnancy;   Darvocet       Resume series after  Lortab        Delivery  Percocet    Yeast:   Tramadol      Femstat  Tylenol 3      Gyne-lotrimin  Ultram       Monistat  Vicodin           MISC:         All Sunscreens           Hair Coloring/highlights          Insect Repellant's          (Including DEET)         Mystic Tans

## 2019-04-06 NOTE — Progress Notes (Signed)
      Beryle Flock presents for NOB nurse intake visit. Pregnancy confirmation done at [facility], 03/22/19, with Melody Shambley.  G1.  P0.  LMP 02/04/19.  EDD 11/16/2019.  Ga [redacted]w[redacted]d. Pregnancy education material explained and given.  0 cats in the home.  NOB labs ordered. BMI greater than 30. TSH/HbgA1c ordered. Sickle cell order due to race. HIV and drug screen explained and ordered. Genetic screening discussed. Genetic testing; Unsure. Pt to discuss genetic testing with provider. PNV encouraged. Pt to follow up with provider in 4 weeks for NOB physical. [place] and FMLA forms reviewed and signed by patient.    Patient needs urine at [facility] for nurse intake labs

## 2019-04-07 LAB — HGB SOLU + RFLX FRAC: Sickle Solubility Test - HGBRFX: NEGATIVE

## 2019-04-07 LAB — ANTIBODY SCREEN: Antibody Screen: NEGATIVE

## 2019-04-07 LAB — HEPATITIS B SURFACE ANTIGEN: Hepatitis B Surface Ag: NEGATIVE

## 2019-04-07 LAB — HEMOGLOBIN A1C
Est. average glucose Bld gHb Est-mCnc: 111 mg/dL
Hgb A1c MFr Bld: 5.5 % (ref 4.8–5.6)

## 2019-04-07 LAB — TSH: TSH: 0.614 u[IU]/mL (ref 0.450–4.500)

## 2019-04-07 LAB — HIV ANTIBODY (ROUTINE TESTING W REFLEX): HIV Screen 4th Generation wRfx: NONREACTIVE

## 2019-04-07 LAB — RUBELLA SCREEN: Rubella Antibodies, IGG: 11.6 index (ref >0.99)

## 2019-04-07 LAB — RPR: RPR Ser Ql: NONREACTIVE

## 2019-04-07 LAB — TOXOPLASMA ANTIBODIES- IGG AND  IGM
Toxoplasma Antibody- IgM: <3 AU/mL (ref 0.0–7.9)
Toxoplasma IgG Ratio: <3 IU/mL (ref 0.0–7.1)

## 2019-04-07 LAB — ABO AND RH: Rh Factor: POSITIVE

## 2019-04-07 LAB — VARICELLA ZOSTER ANTIBODY, IGG: Varicella zoster IgG: 275 index (ref >165)

## 2019-04-10 DIAGNOSIS — F6381 Intermittent explosive disorder: Secondary | ICD-10-CM | POA: Diagnosis not present

## 2019-04-19 DIAGNOSIS — F6381 Intermittent explosive disorder: Secondary | ICD-10-CM | POA: Diagnosis not present

## 2019-04-26 ENCOUNTER — Encounter: Payer: Medicaid Other | Admitting: Obstetrics and Gynecology

## 2019-04-27 ENCOUNTER — Other Ambulatory Visit: Payer: Self-pay

## 2019-04-27 ENCOUNTER — Ambulatory Visit (INDEPENDENT_AMBULATORY_CARE_PROVIDER_SITE_OTHER): Payer: Medicaid Other | Admitting: Obstetrics and Gynecology

## 2019-04-27 ENCOUNTER — Encounter: Payer: Self-pay | Admitting: Obstetrics and Gynecology

## 2019-04-27 VITALS — BP 97/70 | HR 107 | Wt 191.3 lb

## 2019-04-27 DIAGNOSIS — Z23 Encounter for immunization: Secondary | ICD-10-CM | POA: Diagnosis not present

## 2019-04-27 DIAGNOSIS — Z113 Encounter for screening for infections with a predominantly sexual mode of transmission: Secondary | ICD-10-CM | POA: Diagnosis not present

## 2019-04-27 DIAGNOSIS — Z0283 Encounter for blood-alcohol and blood-drug test: Secondary | ICD-10-CM | POA: Diagnosis not present

## 2019-04-27 DIAGNOSIS — Z3A11 11 weeks gestation of pregnancy: Secondary | ICD-10-CM

## 2019-04-27 DIAGNOSIS — Z3491 Encounter for supervision of normal pregnancy, unspecified, first trimester: Secondary | ICD-10-CM

## 2019-04-27 DIAGNOSIS — Z3401 Encounter for supervision of normal first pregnancy, first trimester: Secondary | ICD-10-CM | POA: Diagnosis not present

## 2019-04-27 LAB — POCT URINALYSIS DIPSTICK OB
Bilirubin, UA: NEGATIVE
Blood, UA: NEGATIVE
Glucose, UA: NEGATIVE
Ketones, UA: NEGATIVE
Leukocytes, UA: NEGATIVE
Nitrite, UA: NEGATIVE
POC,PROTEIN,UA: NEGATIVE
Spec Grav, UA: 1.01 (ref 1.010–1.025)
Urobilinogen, UA: 0.2 E.U./dL
pH, UA: 7 (ref 5.0–8.0)

## 2019-04-27 NOTE — Patient Instructions (Signed)

## 2019-04-27 NOTE — Progress Notes (Signed)
Patient comes in today for new OB physical. She has no concerns. She would like to have genetic test today.

## 2019-04-27 NOTE — Progress Notes (Signed)
NEW OB HISTORY AND PHYSICAL  SUBJECTIVE:       Gabriela Anderson is a 18 y.o. G1P0 female, Patient's last menstrual period was 02/04/2019., Estimated Date of Delivery: 11/11/19, [redacted]w[redacted]d, presents today for establishment of Prenatal Care. She has no unusual complaints and complains of mild nausea      Gynecologic History Patient's last menstrual period was 02/04/2019. Normal Contraception: none  Obstetric History OB History  Gravida Para Term Preterm AB Living  1            SAB TAB Ectopic Multiple Live Births               # Outcome Date GA Lbr Len/2nd Weight Sex Delivery Anes PTL Lv  1 Current             Past Medical History:  Diagnosis Date  . Anxiety   . Asthma   . Depression     Past Surgical History:  Procedure Laterality Date  . MYRINGOTOMY      Current Outpatient Medications on File Prior to Visit  Medication Sig Dispense Refill  . amoxicillin (AMOXIL) 500 MG capsule Take 1 capsule (500 mg total) by mouth 3 (three) times daily. 21 capsule 2  . Prenatal Vit-Fe Fumarate-FA (MULTIVITAMIN-PRENATAL) 27-0.8 MG TABS tablet Take 1 tablet by mouth daily at 12 noon.     No current facility-administered medications on file prior to visit.     No Known Allergies  Social History   Socioeconomic History  . Marital status: Single    Spouse name: Not on file  . Number of children: Not on file  . Years of education: Not on file  . Highest education level: Not on file  Occupational History  . Not on file  Social Needs  . Financial resource strain: Not on file  . Food insecurity    Worry: Not on file    Inability: Not on file  . Transportation needs    Medical: Not on file    Non-medical: Not on file  Tobacco Use  . Smoking status: Never Smoker  . Smokeless tobacco: Never Used  Substance and Sexual Activity  . Alcohol use: No  . Drug use: No  . Sexual activity: Yes  Lifestyle  . Physical activity    Days per week: Not on file    Minutes per session: Not on  file  . Stress: Not on file  Relationships  . Social Herbalist on phone: Not on file    Gets together: Not on file    Attends religious service: Not on file    Active member of club or organization: Not on file    Attends meetings of clubs or organizations: Not on file    Relationship status: Not on file  . Intimate partner violence    Fear of current or ex partner: Not on file    Emotionally abused: Not on file    Physically abused: Not on file    Forced sexual activity: Not on file  Other Topics Concern  . Not on file  Social History Narrative  . Not on file    History reviewed. No pertinent family history.  The following portions of the patient's history were reviewed and updated as appropriate: allergies, current medications, past OB history, past medical history, past surgical history, past family history, past social history, and problem list.    OBJECTIVE: Initial Physical Exam (New OB)  GENERAL APPEARANCE: alert, well appearing, in no apparent  distress, oriented to person, place and time, overweight HEAD: normocephalic, atraumatic, sloight indentation in back of head- there since birth MOUTH: mucous membranes moist, pharynx normal without lesions and dental hygiene good THYROID: no thyromegaly or masses present BREASTS: no masses noted, no significant tenderness, no palpable axillary nodes, no skin changes LUNGS: clear to auscultation, no wheezes, rales or rhonchi, symmetric air entry HEART: regular rate and rhythm, no murmurs ABDOMEN: soft, nontender, nondistended, no abnormal masses, no epigastric pain and fundus not palpable EXTREMITIES: no redness or tenderness in the calves or thighs SKIN: normal coloration and turgor, no rashes LYMPH NODES: no adenopathy palpable NEUROLOGIC: alert, oriented, normal speech, no focal findings or movement disorder noted  PELVIC EXAM not indicated  ASSESSMENT: Normal pregnancy BMI 30 Needs flu  vaccine  PLAN: Prenatal care Will return tomorrow for lab draw Flu vaccine given. Monthly urine culture. See orders

## 2019-04-28 ENCOUNTER — Encounter: Payer: Self-pay | Admitting: Obstetrics and Gynecology

## 2019-04-28 ENCOUNTER — Other Ambulatory Visit: Payer: Medicaid Other

## 2019-04-28 DIAGNOSIS — Z3481 Encounter for supervision of other normal pregnancy, first trimester: Secondary | ICD-10-CM | POA: Diagnosis not present

## 2019-04-28 DIAGNOSIS — Z3491 Encounter for supervision of normal pregnancy, unspecified, first trimester: Secondary | ICD-10-CM | POA: Diagnosis not present

## 2019-04-28 LAB — URINALYSIS, ROUTINE W REFLEX MICROSCOPIC
Bilirubin, UA: NEGATIVE
Glucose, UA: NEGATIVE
Ketones, UA: NEGATIVE
Leukocytes,UA: NEGATIVE
Nitrite, UA: NEGATIVE
RBC, UA: NEGATIVE
Specific Gravity, UA: 1.027 (ref 1.005–1.030)
Urobilinogen, Ur: 1 mg/dL (ref 0.2–1.0)
pH, UA: 7 (ref 5.0–7.5)

## 2019-04-28 LAB — MICROSCOPIC EXAMINATION
Casts: NONE SEEN /lpf
Epithelial Cells (non renal): >10 /hpf — AB (ref 0–10)

## 2019-04-29 LAB — CBC WITH DIFFERENTIAL/PLATELET
Basophils Absolute: 0 10*3/uL (ref 0.0–0.2)
Basos: 1 %
EOS (ABSOLUTE): 0.1 10*3/uL (ref 0.0–0.4)
Eos: 1 %
Hematocrit: 32.5 % — ABNORMAL LOW (ref 34.0–46.6)
Hemoglobin: 10.5 g/dL — ABNORMAL LOW (ref 11.1–15.9)
Immature Grans (Abs): 0 10*3/uL (ref 0.0–0.1)
Immature Granulocytes: 0 %
Lymphocytes Absolute: 2.9 10*3/uL (ref 0.7–3.1)
Lymphs: 48 %
MCH: 27.9 pg (ref 26.6–33.0)
MCHC: 32.3 g/dL (ref 31.5–35.7)
MCV: 86 fL (ref 79–97)
Monocytes Absolute: 0.6 10*3/uL (ref 0.1–0.9)
Monocytes: 10 %
Neutrophils Absolute: 2.4 10*3/uL (ref 1.4–7.0)
Neutrophils: 40 %
Platelets: 227 10*3/uL (ref 150–450)
RBC: 3.76 x10E6/uL — ABNORMAL LOW (ref 3.77–5.28)
RDW: 13.9 % (ref 11.7–15.4)
WBC: 6 10*3/uL (ref 3.4–10.8)

## 2019-04-29 LAB — URINE CULTURE, OB REFLEX

## 2019-04-29 LAB — GC/CHLAMYDIA PROBE AMP
Chlamydia trachomatis, NAA: NEGATIVE
Neisseria Gonorrhoeae by PCR: NEGATIVE

## 2019-04-29 LAB — CULTURE, OB URINE

## 2019-05-01 LAB — DRUG PROFILE, UR, 9 DRUGS (LABCORP)
Amphetamines, Urine: NEGATIVE ng/mL
Barbiturate Quant, Ur: NEGATIVE ng/mL
Benzodiazepine Quant, Ur: NEGATIVE ng/mL
Cannabinoid Quant, Ur: POSITIVE — AB
Cocaine (Metab.): NEGATIVE ng/mL
Methadone Screen, Urine: NEGATIVE ng/mL
Opiate Quant, Ur: NEGATIVE ng/mL
PCP Quant, Ur: NEGATIVE ng/mL
Propoxyphene: NEGATIVE ng/mL

## 2019-05-01 LAB — NICOTINE SCREEN, URINE: Cotinine Ql Scrn, Ur: NEGATIVE ng/mL

## 2019-05-03 DIAGNOSIS — F6381 Intermittent explosive disorder: Secondary | ICD-10-CM | POA: Diagnosis not present

## 2019-05-10 ENCOUNTER — Telehealth: Payer: Self-pay

## 2019-05-10 NOTE — Telephone Encounter (Signed)
Attempted to contact patient re: Panorama results. Recording states " the person you are trying to reach is not accepting calls at this time."

## 2019-05-17 DIAGNOSIS — F6381 Intermittent explosive disorder: Secondary | ICD-10-CM | POA: Diagnosis not present

## 2019-05-24 DIAGNOSIS — F6381 Intermittent explosive disorder: Secondary | ICD-10-CM | POA: Diagnosis not present

## 2019-05-26 ENCOUNTER — Ambulatory Visit (INDEPENDENT_AMBULATORY_CARE_PROVIDER_SITE_OTHER): Payer: Medicaid Other | Admitting: Certified Nurse Midwife

## 2019-05-26 ENCOUNTER — Other Ambulatory Visit: Payer: Medicaid Other

## 2019-05-26 ENCOUNTER — Other Ambulatory Visit: Payer: Self-pay

## 2019-05-26 VITALS — BP 101/71 | HR 98 | Wt 187.1 lb

## 2019-05-26 DIAGNOSIS — Z3A16 16 weeks gestation of pregnancy: Secondary | ICD-10-CM

## 2019-05-26 DIAGNOSIS — Z3491 Encounter for supervision of normal pregnancy, unspecified, first trimester: Secondary | ICD-10-CM

## 2019-05-26 DIAGNOSIS — Z3689 Encounter for other specified antenatal screening: Secondary | ICD-10-CM

## 2019-05-26 DIAGNOSIS — Z3492 Encounter for supervision of normal pregnancy, unspecified, second trimester: Secondary | ICD-10-CM

## 2019-05-26 DIAGNOSIS — N390 Urinary tract infection, site not specified: Secondary | ICD-10-CM | POA: Diagnosis not present

## 2019-05-26 LAB — POCT URINALYSIS DIPSTICK OB
Bilirubin, UA: NEGATIVE
Blood, UA: NEGATIVE
Glucose, UA: NEGATIVE
Leukocytes, UA: NEGATIVE
Nitrite, UA: NEGATIVE
Spec Grav, UA: >=1.03 — AB (ref 1.010–1.025)
Urobilinogen, UA: 0.2 E.U./dL
pH, UA: 6 (ref 5.0–8.0)

## 2019-05-26 NOTE — Progress Notes (Signed)
ROB-Notes nausea without vomiting until today. Unable to tolerate glucose test, alternative list given-will repeat next visit. Genetic screening results provided in envelope for gender reveal. Urine culture today due to history frequent UTI, see orders. Anticipatory guidance regarding course of prenatal care. Reviewed red flag symptoms and when to call. RTC x 4 weeks for ANATOMY SCAN and ROB or sooner if needed.

## 2019-05-26 NOTE — Addendum Note (Signed)
Addended by: Cherre Huger on: 05/26/2019 05:08 PM   Modules accepted: Orders

## 2019-05-26 NOTE — Patient Instructions (Addendum)
Round Ligament Pain  The round ligament is a cord of muscle and tissue that helps support the uterus. It can become a source of pain during pregnancy if it becomes stretched or twisted as the baby grows. The pain usually begins in the second trimester (13-28 weeks) of pregnancy, and it can come and go until the baby is delivered. It is not a serious problem, and it does not cause harm to the baby. Round ligament pain is usually a short, sharp, and pinching pain, but it can also be a dull, lingering, and aching pain. The pain is felt in the lower side of the abdomen or in the groin. It usually starts deep in the groin and moves up to the outside of the hip area. The pain may occur when you:  Suddenly change position, such as quickly going from a sitting to standing position.  Roll over in bed.  Cough or sneeze.  Do physical activity. Follow these instructions at home:   Watch your condition for any changes.  When the pain starts, relax. Then try any of these methods to help with the pain: ? Sitting down. ? Flexing your knees up to your abdomen. ? Lying on your side with one pillow under your abdomen and another pillow between your legs. ? Sitting in a warm bath for 15-20 minutes or until the pain goes away.  Take over-the-counter and prescription medicines only as told by your health care provider.  Move slowly when you sit down or stand up.  Avoid long walks if they cause pain.  Stop or reduce your physical activities if they cause pain.  Keep all follow-up visits as told by your health care provider. This is important. Contact a health care provider if:  Your pain does not go away with treatment.  You feel pain in your back that you did not have before.  Your medicine is not helping. Get help right away if:  You have a fever or chills.  You develop uterine contractions.  You have vaginal bleeding.  You have nausea or vomiting.  You have diarrhea.  You have pain  when you urinate. Summary  Round ligament pain is felt in the lower abdomen or groin. It is usually a short, sharp, and pinching pain. It can also be a dull, lingering, and aching pain.  This pain usually begins in the second trimester (13-28 weeks). It occurs because the uterus is stretching with the growing baby, and it is not harmful to the baby.  You may notice the pain when you suddenly change position, when you cough or sneeze, or during physical activity.  Relaxing, flexing your knees to your abdomen, lying on one side, or taking a warm bath may help to get rid of the pain.  Get help from your health care provider if the pain does not go away or if you have vaginal bleeding, nausea, vomiting, diarrhea, or painful urination. This information is not intended to replace advice given to you by your health care provider. Make sure you discuss any questions you have with your health care provider. Document Released: 04/14/2008 Document Revised: 12/22/2017 Document Reviewed: 12/22/2017 Elsevier Patient Education  [place]. Back Pain in Pregnancy Back pain during pregnancy is common. Back pain may be caused by several factors that are related to changes during your pregnancy. Follow these instructions at home: Managing pain, stiffness, and swelling      If directed, for sudden (acute) back pain, put ice on the painful  area. ? Put ice in a plastic bag. ? Place a towel between your skin and the bag. ? Leave the ice on for 20 minutes, 2-3 times per day.  If directed, apply heat to the affected area before you exercise. Use the heat source that your health care provider recommends, such as a moist heat pack or a heating pad. ? Place a towel between your skin and the heat source. ? Leave the heat on for 20-30 minutes. ? Remove the heat if your skin turns bright red. This is especially important if you are unable to feel pain, heat, or cold. You may have a greater risk of getting  burned.  If directed, massage the affected area. Activity  Exercise as told by your health care provider. Gentle exercise is the best way to prevent or manage back pain.  Listen to your body when lifting. If lifting hurts, ask for help or bend your knees. This uses your leg muscles instead of your back muscles.  Squat down when picking up something from the floor. Do not bend over.  Only use bed rest for short periods as told by your health care provider. Bed rest should only be used for the most severe episodes of back pain. Standing, sitting, and lying down  Do not stand in one place for long periods of time.  Use good posture when sitting. Make sure your head rests over your shoulders and is not hanging forward. Use a pillow on your lower back if necessary.  Try sleeping on your side, preferably the left side, with a pregnancy support pillow or 1-2 regular pillows between your legs. ? If you have back pain after a night's rest, your bed may be too soft. ? A firm mattress may provide more support for your back during pregnancy. General instructions  Do not wear high heels.  Eat a healthy diet. Try to gain weight within your health care provider's recommendations.  Use a maternity girdle, elastic sling, or back brace as told by your health care provider.  Take over-the-counter and prescription medicines only as told by your health care provider.  Work with a physical therapist or massage therapist to find ways to manage back pain. Acupuncture or massage therapy may be helpful.  Keep all follow-up visits as told by your health care provider. This is important. Contact a health care provider if:  Your back pain interferes with your daily activities.  You have increasing pain in other parts of your body. Get help right away if:  You develop numbness, tingling, weakness, or problems with the use of your arms or legs.  You develop severe back pain that is not controlled with  medicine.  You have a change in bowel or bladder control.  You develop shortness of breath, dizziness, or you faint.  You develop nausea, vomiting, or sweating.  You have back pain that is a rhythmic, cramping pain similar to labor pains. Labor pain is usually 1-2 minutes apart, lasts for about 1 minute, and involves a bearing down feeling or pressure in your pelvis.  You have back pain and your water breaks or you have vaginal bleeding.  You have back pain or numbness that travels down your leg.  Your back pain developed after you fell.  You develop pain on one side of your back.  You see blood in your urine.  You develop skin blisters in the area of your back pain. Summary  Back pain may be caused by several factors  that are related to changes during your pregnancy.  Follow instructions as told by your health care provider for managing pain, stiffness, and swelling.  Exercise as told by your health care provider. Gentle exercise is the best way to prevent or manage back pain.  Take over-the-counter and prescription medicines only as told by your health care provider.  Keep all follow-up visits as told by your health care provider. This is important. This information is not intended to replace advice given to you by your health care provider. Make sure you discuss any questions you have with your health care provider. Document Released: 10/14/2005 Document Revised: 10/25/2018 Document Reviewed: 12/22/2017 Elsevier Patient Education  2020 [employer].   Eating Plan for Pregnant Women While you are pregnant, your body requires additional nutrition to help support your growing baby. You also have a higher need for some vitamins and minerals, such as folic acid, calcium, iron, and vitamin D. Eating a healthy, well-balanced diet is very important for your health and your baby's health. Your need for extra calories varies for the three 15-month segments of your pregnancy  (trimesters). For most women, it is recommended to consume:  150 extra calories a day during the first trimester.  300 extra calories a day during the second trimester.  300 extra calories a day during the third trimester. What are tips for following this plan?   Do not try to lose weight or go on a diet during pregnancy.  Limit your overall intake of foods that have "empty calories." These are foods that have little nutritional value, such as sweets, desserts, candies, and sugar-sweetened beverages.  Eat a variety of foods (especially fruits and vegetables) to get a full range of vitamins and minerals.  Take a prenatal vitamin to help meet your additional vitamin and mineral needs during pregnancy, specifically for folic acid, iron, calcium, and vitamin D.  Remember to stay active. Ask your health care provider what types of exercise and activities are safe for you.  Practice good food safety and cleanliness. Wash your hands before you eat and after you prepare raw meat. Wash all fruits and vegetables well before peeling or eating. Taking these actions can help to prevent food-borne illnesses that can be very dangerous to your baby, such as listeriosis. Ask your health care provider for more information about listeriosis. What does 150 extra calories look like? Healthy options that provide 150 extra calories each day could be any of the following:  6-8 oz (170-230 g) of plain low-fat yogurt with  cup of berries.  1 apple with 2 teaspoons (11 g) of peanut butter.  Cut-up vegetables with  cup (60 g) of hummus.  8 oz (230 mL) or 1 cup of low-fat chocolate milk.  1 stick of string cheese with 1 medium orange.  1 peanut butter and jelly sandwich that is made with one slice of whole-wheat bread and 1 tsp (5 g) of peanut butter. For 300 extra calories, you could eat two of those healthy options each day. What is a healthy amount of weight to gain? The right amount of weight gain for  you is based on your BMI before you became pregnant. If your BMI:  Was less than 18 (underweight), you should gain 28-40 lb (13-18 kg).  Was 18-24.9 (normal), you should gain 25-35 lb (11-16 kg).  Was 25-29.9 (overweight), you should gain 15-25 lb (7-11 kg).  Was 30 or greater (obese), you should gain 11-20 lb (5-9 kg). What if I  am having twins or multiples? Generally, if you are carrying twins or multiples:  You may need to eat 300-600 extra calories a day.  The recommended range for total weight gain is 25-54 lb (11-25 kg), depending on your BMI before pregnancy.  Talk with your health care provider to find out about nutritional needs, weight gain, and exercise that is right for you. What foods can I eat?  Grains All grains. Choose whole grains, such as whole-wheat bread, oatmeal, or brown rice. Vegetables All vegetables. Eat a variety of colors and types of vegetables. Remember to wash your vegetables well before peeling or eating. Fruits All fruits. Eat a variety of colors and types of fruit. Remember to wash your fruits well before peeling or eating. Meats and other protein foods Lean meats, including chicken, [place], fish, and lean cuts of beef, veal, or pork. If you eat fish or seafood, choose options that are higher in omega-3 fatty acids and lower in mercury, such as salmon, herring, mussels, trout, sardines, pollock, shrimp, crab, and lobster. Tofu. Tempeh. Beans. Eggs. Peanut butter and other nut butters. Make sure that all meats, poultry, and eggs are cooked to food-safe temperatures or "well-done." Two or more servings of fish are recommended each week in order to get the most benefits from omega-3 fatty acids that are found in seafood. Choose fish that are lower in mercury. You can find more information online:  PumpkinSearch.com.eewww.fda.gov Dairy Pasteurized milk and milk alternatives (such as almond milk). Pasteurized yogurt and pasteurized cheese. Cottage cheese. Sour  cream. Beverages Water. Juices that contain 100% fruit juice or vegetable juice. Caffeine-free teas and decaffeinated coffee. Drinks that contain caffeine are okay to drink, but it is better to avoid caffeine. Keep your total caffeine intake to less than 200 mg each day (which is 12 oz or 355 mL of coffee, tea, or soda) or the limit as told by your health care provider. Fats and oils Fats and oils are okay to include in moderation. Sweets and desserts Sweets and desserts are okay to include in moderation. Seasoning and other foods All pasteurized condiments. The items listed above may not be a complete list of recommended foods and beverages. Contact your dietitian for more options. The items listed above may not be a complete list of foods and beverages [you/your child] can eat. Contact a dietitian for more information. What foods are not recommended? Vegetables Raw (unpasteurized) vegetable juices. Fruits Unpasteurized fruit juices. Meats and other protein foods Lunch meats, bologna, hot dogs, or other deli meats. (If you must eat those meats, reheat them until they are steaming hot.) Refrigerated pat, meat spreads from a meat counter, smoked seafood that is found in the refrigerated section of a store. Raw or undercooked meats, poultry, and eggs. Raw fish, such as sushi or sashimi. Fish that have high mercury content, such as tilefish, shark, swordfish, and king mackerel. To learn more about mercury in fish, talk with your health care provider or look for online resources, such as:  PumpkinSearch.com.eewww.fda.gov Dairy Raw (unpasteurized) milk and any foods that have raw milk in them. Soft cheeses, such as feta, queso blanco, queso fresco, Brie, Camembert cheeses, blue-veined cheeses, and Panela cheese (unless it is made with pasteurized milk, which must be stated on the label). Beverages Alcohol. Sugar-sweetened beverages, such as sodas, teas, or energy drinks. Seasoning and other foods Homemade  fermented foods and drinks, such as pickles, sauerkraut, or kombucha drinks. (Store-bought pasteurized versions of these are okay.) Salads that are made  in a store or deli, such as ham salad, chicken salad, egg salad, tuna salad, and seafood salad. The items listed above may not be a complete list of foods and beverages to avoid. Contact your dietitian for more information. The items listed above may not be a complete list of foods and beverages [you/your child] should avoid. Contact a dietitian for more information. Where to find more information To calculate the number of calories you need based on your height, weight, and activity level, you can use an online calculator such as:  MobileTransition.ch To calculate how much weight you should gain during pregnancy, you can use an online pregnancy weight gain calculator such as:  StreamingFood.com.cy Summary  While you are pregnant, your body requires additional nutrition to help support your growing baby.  Eat a variety of foods, especially fruits and vegetables to get a full range of vitamins and minerals.  Practice good food safety and cleanliness. Wash your hands before you eat and after you prepare raw meat. Wash all fruits and vegetables well before peeling or eating. Taking these actions can help to prevent food-borne illnesses, such as listeriosis, that can be very dangerous to your baby.  Do not eat raw meat or fish. Do not eat fish that have high mercury content, such as tilefish, shark, swordfish, and king mackerel. Do not eat unpasteurized (raw) dairy.  Take a prenatal vitamin to help meet your additional vitamin and mineral needs during pregnancy, specifically for folic acid, iron, calcium, and vitamin D. This information is not intended to replace advice given to you by your health care provider. Make sure you discuss any questions you have with your health care provider. Document  Released: 04/20/2014 Document Revised: 10/27/2018 Document Reviewed: 04/02/2017 Elsevier Patient Education  [place] [employer] Gain During Pregnancy, Adult A certain amount of weight gain during pregnancy is normal and healthy. How much weight you should gain depends on your overall health and a measurement called BMI (body mass index). BMI is an estimate of your body fat based on your height and weight. You can use an Freight forwarder to figure out your BMI, or you can ask your health care provider to calculate it for you at your next visit. Your recommended pregnancy weight gain is based on your pre-pregnancy BMI. General guidelines for a healthy total weight gain during pregnancy are listed below. If your BMI at or before the start of your pregnancy is:  Less than 18.5 (underweight), you should gain 28-40 lb (13-18 kg).  18.5-24.9 (normal weight), you should gain 25-35 lb (11-16 kg).  25-29.9 (overweight), you should gain 15-25 lb (7-11 kg).  30 or higher (obese), you should gain 11-20 lb (5-9 kg). These ranges vary depending on your individual health. If you are carrying more than one baby (multiples), it may be safe to gain more weight than these recommendations. If you gain less weight than recommended, that may be safe as long as your baby is growing and developing normally. How can unhealthy weight gain affect me and my baby? Gaining too much weight during pregnancy can lead to pregnancy complications, such as:  A temporary form of diabetes that develops during pregnancy (gestational diabetes).  High blood pressure during pregnancy and protein in your urine (preeclampsia).  High blood pressure during pregnancy without protein in your urine (gestational hypertension).  Your baby having a high weight at birth, which may: ? Raise your risk of having a more difficult delivery or a  surgical delivery (cesarean delivery, or C-section). ? Raise your child's risk of  developing obesity during childhood. Not gaining enough weight can be life-threatening for your baby, and it may raise your baby's chances of:  Being born early (preterm).  Growing more slowly than normal during pregnancy (growth restriction).  Having a low weight at birth. What actions can I take to gain a healthy amount of weight during pregnancy? General instructions  Keep track of your weight gain during pregnancy.  Take over-the-counter and prescription medicines only as told by your health care provider. Take all prenatal supplements as directed.  Keep all health care visits during pregnancy (prenatal visits). These visits are a good time to discuss your weight gain. Your health care provider will weigh you at each visit to make sure you are gaining a healthy amount of weight. Nutrition   Eat a balanced, nutrient-rich diet. Eat plenty of: ? Fruits and vegetables, such as berries and broccoli. ? Whole grains, such as millet, barley, whole-wheat breads and cereals, and oatmeal. ? Low-fat dairy products or non-dairy products such as almond milk or rice milk. ? Protein foods, such as lean meat, chicken, eggs, and legumes (such as peas, beans, soybeans, and lentils).  Avoid foods that are fried or have a lot of fat, salt (sodium), or sugar.  Drink enough fluid to keep your urine pale yellow.  Choose healthy snack and drink options when you are at work or on the go: ? Drink water. Avoid soda, sports drinks, and juices that have added sugar. ? Avoid drinks with caffeine, such as coffee and energy drinks. ? Eat snacks that are high in protein, such as nuts, protein bars, and low-fat yogurt. ? Carry convenient snacks in your purse that do not need refrigeration, such as a pack of trail mix, an apple, or a granola bar.  If you need help improving your diet, work with a health care provider or a diet and nutrition specialist (dietitian). Activity   Exercise regularly, as told by  your health care provider. ? If you were active before becoming pregnant, you may be able to continue your regular fitness activities. ? If you were not active before pregnancy, you may gradually build up to exercising for 30 or more minutes on most days of the week. This may include walking, swimming, or yoga.  Ask your health care provider what activities are safe for you. Talk with your health care provider about whether you may need to be excused from certain school or work activities. Where to find more information Learn more about managing your weight gain during pregnancy from:  American Pregnancy Association: www.americanpregnancy.org  U.S. Department of Agriculture pregnancy weight gain calculator: https://ball-collins.biz/ Summary  Too much weight gain during pregnancy can lead to complications for you and your baby.  Find out your pre-pregnancy BMI to determine how much weight gain is healthy for you.  Eat nutritious foods and stay active.  Keep all of your prenatal visits as told by your health care provider. This information is not intended to replace advice given to you by your health care provider. Make sure you discuss any questions you have with your health care provider. Document Released: 03/26/2017 Document Revised: 03/26/2017 Document Reviewed: 03/26/2017 Elsevier Patient Education  2020 [employer].

## 2019-05-26 NOTE — Progress Notes (Signed)
ROB-No complaints.  

## 2019-05-28 LAB — URINE CULTURE

## 2019-06-21 ENCOUNTER — Ambulatory Visit (INDEPENDENT_AMBULATORY_CARE_PROVIDER_SITE_OTHER): Payer: Medicaid Other

## 2019-06-21 ENCOUNTER — Other Ambulatory Visit: Payer: Medicaid Other

## 2019-06-21 ENCOUNTER — Other Ambulatory Visit: Payer: Self-pay

## 2019-06-21 ENCOUNTER — Ambulatory Visit (INDEPENDENT_AMBULATORY_CARE_PROVIDER_SITE_OTHER): Payer: Medicaid Other | Admitting: Certified Nurse Midwife

## 2019-06-21 VITALS — BP 108/59 | HR 84 | Wt 198.2 lb

## 2019-06-21 DIAGNOSIS — Z3689 Encounter for other specified antenatal screening: Secondary | ICD-10-CM

## 2019-06-21 DIAGNOSIS — Z3492 Encounter for supervision of normal pregnancy, unspecified, second trimester: Secondary | ICD-10-CM | POA: Diagnosis not present

## 2019-06-21 DIAGNOSIS — N390 Urinary tract infection, site not specified: Secondary | ICD-10-CM | POA: Diagnosis not present

## 2019-06-21 DIAGNOSIS — Z363 Encounter for antenatal screening for malformations: Secondary | ICD-10-CM | POA: Diagnosis not present

## 2019-06-21 LAB — POCT URINALYSIS DIPSTICK OB
Bilirubin, UA: NEGATIVE
Blood, UA: NEGATIVE
Glucose, UA: NEGATIVE
Ketones, UA: NEGATIVE
Leukocytes, UA: NEGATIVE
Nitrite, UA: NEGATIVE
POC,PROTEIN,UA: NEGATIVE
Spec Grav, UA: 1.025 (ref 1.010–1.025)
Urobilinogen, UA: 0.2 E.U./dL
pH, UA: 5 (ref 5.0–8.0)

## 2019-06-21 NOTE — Progress Notes (Signed)
ROB doing well. Anatomy u/s today(incomplete, see below). Results reviewed. Early glucose screen today. Follow up 2 wks for complete on anatomy. ROB in 4-5wks .   Philip Aspen, CNM   Patient Name: Gabriela Anderson DOB: 2001/03/18 MRN: [id number] ULTRASOUND REPORT  Location: [facility] Date of Service: 06/21/2019   Indications:Anatomy Ultrasound Findings:  Nelda [place] intrauterine pregnancy is visualized with FHR at 138 BPM. Biometrics give an (U/S) Gestational age of [redacted]w[redacted]d and an (U/S) EDD of 11/11/2019; this correlates with the clinically established Estimated Date of Delivery: 11/11/19  Fetal presentation is Cephalic.  EFW: 307 g ( 11 oz). Fetal Percentile  Placenta: anterior. Grade: 1 AFI: subjectively normal.  Anatomic survey is incomplete for face, 4 ch heart and normal; Gender - female.    Right Ovary is normal in appearance. Left Ovary is normal appearance. Survey of the adnexa demonstrates no adnexal masses. There is no free peritoneal fluid in the cul de sac.  Impression: 1. [redacted]w[redacted]d Viable Singleton Intrauterine pregnancy by U/S. 2. (U/S) EDD is consistent with Clinically established Estimated Date of Delivery: 11/11/19 . 3. Incomplete for face, 4 ch heart. Recommendations: 1.Clinical correlation with the patient's History and Physical Exam.   Jenine M. Albertine Grates    RDMS

## 2019-06-21 NOTE — Patient Instructions (Signed)

## 2019-06-22 LAB — GLUCOSE, 1 HOUR GESTATIONAL: Gestational Diabetes Screen: 85 mg/dL (ref 65–139)

## 2019-06-23 LAB — URINE CULTURE

## 2019-07-06 ENCOUNTER — Other Ambulatory Visit: Payer: Self-pay

## 2019-07-06 ENCOUNTER — Ambulatory Visit (INDEPENDENT_AMBULATORY_CARE_PROVIDER_SITE_OTHER): Payer: Medicaid Other

## 2019-07-06 DIAGNOSIS — Z362 Encounter for other antenatal screening follow-up: Secondary | ICD-10-CM | POA: Diagnosis not present

## 2019-07-06 DIAGNOSIS — Z3492 Encounter for supervision of normal pregnancy, unspecified, second trimester: Secondary | ICD-10-CM

## 2019-07-10 ENCOUNTER — Other Ambulatory Visit: Payer: Self-pay | Admitting: Certified Nurse Midwife

## 2019-07-10 DIAGNOSIS — Z3403 Encounter for supervision of normal first pregnancy, third trimester: Secondary | ICD-10-CM

## 2019-07-21 NOTE — L&D Delivery Note (Signed)
Delivery Note  0245 In room to see patient, fetal vertex visible on perineum behind bulging back of water. AROM moderate amount of clear fluid. Effective coached maternal pushing techniques.   Spontaneous vaginal birth of liveborn female infant in right occiput anterior position at 9. Infant immediately to maternal abdomen, delayed cord clamping, skin to skin, three (3) vessel cord, and tub of cord blood obtained. APGARs: 8, 9. Weight pending. Receiving nurse present at bedside for birth.   Pitocin bolus infusing. Spontaneous delivery of intact placenta at 0259 with trailing membranes. Vault check completed under adequate epidural anesthesia. Symmetrical bilateral labial lacerations, right side repaired with 3-0 vicryl rapide. Lacerations well approximated and hemostatic. QBL: 655 ml. Fundus firm. Rubra small.   Initiate routine postpartum care and orders. Mom to postpartum.  Baby to Couplet care / Skin to Skin.  Patient's mother present at bedside and overjoyed with the birth of "Syncier".    Gunnar Bulla, CNM [facility], [facility] 11/08/2019, 3:15 AM

## 2019-07-24 ENCOUNTER — Encounter: Payer: Medicaid Other | Admitting: Certified Nurse Midwife

## 2019-07-26 DIAGNOSIS — F6381 Intermittent explosive disorder: Secondary | ICD-10-CM | POA: Diagnosis not present

## 2019-08-02 ENCOUNTER — Encounter: Payer: Self-pay | Admitting: Certified Nurse Midwife

## 2019-08-02 DIAGNOSIS — F6381 Intermittent explosive disorder: Secondary | ICD-10-CM | POA: Diagnosis not present

## 2019-08-08 ENCOUNTER — Encounter: Payer: Self-pay | Admitting: Certified Nurse Midwife

## 2019-08-08 ENCOUNTER — Ambulatory Visit (INDEPENDENT_AMBULATORY_CARE_PROVIDER_SITE_OTHER): Payer: Self-pay | Admitting: Certified Nurse Midwife

## 2019-08-08 ENCOUNTER — Other Ambulatory Visit: Payer: Self-pay

## 2019-08-08 VITALS — BP 129/71 | HR 85 | Wt 214.4 lb

## 2019-08-08 DIAGNOSIS — Z3403 Encounter for supervision of normal first pregnancy, third trimester: Secondary | ICD-10-CM

## 2019-08-08 DIAGNOSIS — N390 Urinary tract infection, site not specified: Secondary | ICD-10-CM

## 2019-08-08 DIAGNOSIS — Z3A26 26 weeks gestation of pregnancy: Secondary | ICD-10-CM

## 2019-08-08 LAB — POCT URINALYSIS DIPSTICK OB
Bilirubin, UA: NEGATIVE
Blood, UA: NEGATIVE
Glucose, UA: NEGATIVE
Ketones, UA: NEGATIVE
Leukocytes, UA: NEGATIVE
Nitrite, UA: NEGATIVE
POC,PROTEIN,UA: NEGATIVE
Spec Grav, UA: 1.02
Urobilinogen, UA: 0.2 U/dL
pH, UA: 5

## 2019-08-08 NOTE — Patient Instructions (Signed)
Glucose Tolerance Test During Pregnancy Why am I having this test? The glucose tolerance test (GTT) is done to check how your body processes sugar (glucose). This is one of several tests used to diagnose diabetes that develops during pregnancy (gestational diabetes mellitus). Gestational diabetes is a temporary form of diabetes that some women develop during pregnancy. It usually occurs during the second trimester of pregnancy and goes away after delivery. Testing (screening) for gestational diabetes usually occurs between 24 and 28 weeks of pregnancy. You may have the GTT test after having a 1-hour glucose screening test if the results from that test indicate that you may have gestational diabetes. You may also have this test if:  You have a history of gestational diabetes.  You have a history of giving birth to very large babies or have experienced repeated fetal loss (stillbirth).  You have signs and symptoms of diabetes, such as: ? Changes in your vision. ? Tingling or numbness in your hands or feet. ? Changes in hunger, thirst, and urination that are not otherwise explained by your pregnancy. What is being tested? This test measures the amount of glucose in your blood at different times during a period of 3 hours. This indicates how well your body is able to process glucose. What kind of sample is taken?  Blood samples are required for this test. They are usually collected by inserting a needle into a blood vessel. How do I prepare for this test?  For 3 days before your test, eat normally. Have plenty of carbohydrate-rich foods.  Follow instructions from your health care provider about: ? Eating or drinking restrictions on the day of the test. You may be asked to not eat or drink anything other than water (fast) starting 8-10 hours before the test. ? Changing or stopping your regular medicines. Some medicines may interfere with this test. Tell a health care provider about:  All  medicines you are taking, including vitamins, herbs, eye drops, creams, and over-the-counter medicines.  Any blood disorders you have.  Any surgeries you have had.  Any medical conditions you have. What happens during the test? First, your blood glucose will be measured. This is referred to as your fasting blood glucose, since you fasted before the test. Then, you will drink a glucose solution that contains a certain amount of glucose. Your blood glucose will be measured again 1, 2, and 3 hours after drinking the solution. This test takes about 3 hours to complete. You will need to stay at the testing location during this time. During the testing period:  Do not eat or drink anything other than the glucose solution.  Do not exercise.  Do not use any products that contain nicotine or tobacco, such as cigarettes and e-cigarettes. If you need help stopping, ask your health care provider. The testing procedure may vary among health care providers and hospitals. How are the results reported? Your results will be reported as milligrams of glucose per deciliter of blood (mg/dL) or millimoles per liter (mmol/L). Your health care provider will compare your results to normal ranges that were established after testing a large group of people (reference ranges). Reference ranges may vary among labs and hospitals. For this test, common reference ranges are:  Fasting: less than 95-105 mg/dL (5.3-5.8 mmol/L).  1 hour after drinking glucose: less than 180-190 mg/dL (10.0-10.5 mmol/L).  2 hours after drinking glucose: less than 155-165 mg/dL (8.6-9.2 mmol/L).  3 hours after drinking glucose: 140-145 mg/dL (7.8-8.1 mmol/L). What do the   results mean? Results within reference ranges are considered normal, meaning that your glucose levels are well-controlled. If two or more of your blood glucose levels are high, you may be diagnosed with gestational diabetes. If only one level is high, your health care  provider may suggest repeat testing or other tests to confirm a diagnosis. Talk with your health care provider about what your results mean. Questions to ask your health care provider Ask your health care provider, or the department that is doing the test:  When will my results be ready?  How will I get my results?  What are my treatment options?  What other tests do I need?  What are my next steps? Summary  The glucose tolerance test (GTT) is one of several tests used to diagnose diabetes that develops during pregnancy (gestational diabetes mellitus). Gestational diabetes is a temporary form of diabetes that some women develop during pregnancy.  You may have the GTT test after having a 1-hour glucose screening test if the results from that test indicate that you may have gestational diabetes. You may also have this test if you have any symptoms or risk factors for gestational diabetes.  Talk with your health care provider about what your results mean. This information is not intended to replace advice given to you by your health care provider. Make sure you discuss any questions you have with your health care provider. Document Revised: 10/27/2018 Document Reviewed: 02/15/2017 Elsevier Patient Education  2020 Elsevier Inc.  

## 2019-08-08 NOTE — Progress Notes (Signed)
ROB doing well. Feels good movement. Discussed glucose screen and u/s at next visit due to echogenic foci on anatomy scan. She verbalizes and agrees to plan. Follow up 2 wks.   Doreene Burke, CNM

## 2019-08-09 DIAGNOSIS — F6381 Intermittent explosive disorder: Secondary | ICD-10-CM | POA: Diagnosis not present

## 2019-08-10 LAB — URINE CULTURE: Organism ID, Bacteria: NO GROWTH

## 2019-08-16 DIAGNOSIS — F6381 Intermittent explosive disorder: Secondary | ICD-10-CM | POA: Diagnosis not present

## 2019-08-23 ENCOUNTER — Other Ambulatory Visit: Payer: Medicaid Other

## 2019-08-23 ENCOUNTER — Other Ambulatory Visit: Payer: Self-pay

## 2019-08-23 ENCOUNTER — Ambulatory Visit (INDEPENDENT_AMBULATORY_CARE_PROVIDER_SITE_OTHER): Payer: Medicaid Other | Admitting: Certified Nurse Midwife

## 2019-08-23 VITALS — BP 133/77 | HR 87 | Wt 212.5 lb

## 2019-08-23 DIAGNOSIS — Z3403 Encounter for supervision of normal first pregnancy, third trimester: Secondary | ICD-10-CM

## 2019-08-23 DIAGNOSIS — F6381 Intermittent explosive disorder: Secondary | ICD-10-CM | POA: Diagnosis not present

## 2019-08-23 LAB — POCT URINALYSIS DIPSTICK OB
Bilirubin, UA: NEGATIVE
Blood, UA: NEGATIVE
Glucose, UA: NEGATIVE
Ketones, UA: NEGATIVE
Leukocytes, UA: NEGATIVE
Nitrite, UA: NEGATIVE
POC,PROTEIN,UA: NEGATIVE
Spec Grav, UA: 1.025 (ref 1.010–1.025)
Urobilinogen, UA: 0.2 E.U./dL
pH, UA: 5 (ref 5.0–8.0)

## 2019-08-23 MED ORDER — TETANUS-DIPHTH-ACELL PERTUSSIS 5-2.5-18.5 LF-MCG/0.5 IM SUSP
0.5000 mL | Freq: Once | INTRAMUSCULAR | Status: AC
  Administered 2019-08-23: 0.5 mL via INTRAMUSCULAR

## 2019-08-23 NOTE — Patient Instructions (Signed)
Td (Tetanus, Diphtheria) Vaccine: What You Need to Know 1. Why get vaccinated? Td vaccine can prevent tetanus and diphtheria. Tetanus enters the body through cuts or wounds. Diphtheria spreads from person to person.  TETANUS (T) causes painful stiffening of the muscles. Tetanus can lead to serious health problems, including being unable to open the mouth, having trouble swallowing and breathing, or death.  DIPHTHERIA (D) can lead to difficulty breathing, heart failure, paralysis, or death. 2. Td vaccine Td is only for children 7 years and older, adolescents, and adults.  Td is usually given as a booster dose every 10 years, but it can also be given earlier after a severe and dirty wound or burn. Another vaccine, called Tdap, that protects against pertussis, also known as "whooping cough," in addition to tetanus and diphtheria, may be used instead of Td.  Td may be given at the same time as other vaccines. 3. Talk with your health care provider Tell your vaccine provider if the person getting the vaccine:  Has had an allergic reaction after a previous dose of any vaccine that protects against tetanus or diphtheria, or has any severe, life-threatening allergies.  Has ever had Guillain-Barr Syndrome (also called GBS).  Has had severe pain or swelling after a previous dose of any vaccine that protects against tetanus or diphtheria. In some cases, your health care provider may decide to postpone Td vaccination to a future visit.  People with minor illnesses, such as a cold, may be vaccinated. People who are moderately or severely ill should usually wait until they recover before getting Td vaccine.  Your health care provider can give you more information. 4. Risks of a vaccine reaction  Pain, redness, or swelling where the shot was given, mild fever, headache, feeling tired, and nausea, vomiting, diarrhea, or stomachache sometimes happen after Td vaccine. People sometimes faint after medical  procedures, including vaccination. Tell your provider if you feel dizzy or have vision changes or ringing in the ears.  As with any medicine, there is a very remote chance of a vaccine causing a severe allergic reaction, other serious injury, or death. 5. What if there is a serious problem? An allergic reaction could occur after the vaccinated person leaves the clinic. If you see signs of a severe allergic reaction (hives, swelling of the face and throat, difficulty breathing, a fast heartbeat, dizziness, or weakness), call 9-1-1 and get the person to the nearest hospital.  For other signs that concern you, call your health care provider.  Adverse reactions should be reported to the Vaccine Adverse Event Reporting System (VAERS). Your health care provider will usually file this report, or you can do it yourself. Visit the VAERS website at www.vaers.hhs.gov or call 1-800-822-7967. VAERS is only for reporting reactions, and VAERS staff do not give medical advice. 6. The National Vaccine Injury Compensation Program The National Vaccine Injury Compensation Program (VICP) is a federal program that was created to compensate people who may have been injured by certain vaccines. Visit the VICP website at www.hrsa.gov/vaccinecompensation or call 1-800-338-2382 to learn about the program and about filing a claim. There is a time limit to file a claim for compensation. 7. How can I learn more?  Ask your health care provider.  Call your local or state health department.  Contact the Centers for Disease Control and Prevention (CDC): ? Call 1-800-232-4636 (1-800-CDC-INFO) or ? Visit CDC's website at www.cdc.gov/vaccines Vaccine Information Statement Td Vaccine (10/19/18) This information is not intended to replace advice given   to you by your health care provider. Make sure you discuss any questions you have with your health care provider. Document Revised: 11/28/2018 Document Reviewed: 10/31/2018 Elsevier  Patient Education  2020 [employer] Inc. Glucose Tolerance Test During Pregnancy Why am I having this test? The glucose tolerance test (GTT) is done to check how your body processes sugar (glucose). This is one of several tests used to diagnose diabetes that develops during pregnancy (gestational diabetes mellitus). Gestational diabetes is a temporary form of diabetes that some women develop during pregnancy. It usually occurs during the second trimester of pregnancy and goes away after delivery. Testing (screening) for gestational diabetes usually occurs between 24 and 28 weeks of pregnancy. You may have the GTT test after having a 1-hour glucose screening test if the results from that test indicate that you may have gestational diabetes. You may also have this test if:  You have a history of gestational diabetes.  You have a history of giving birth to very large babies or have experienced repeated fetal loss (stillbirth).  You have signs and symptoms of diabetes, such as: ? Changes in your vision. ? Tingling or numbness in your hands or feet. ? Changes in hunger, thirst, and urination that are not otherwise explained by your pregnancy. What is being tested? This test measures the amount of glucose in your blood at different times during a period of 3 hours. This indicates how well your body is able to process glucose. What kind of sample is taken?  Blood samples are required for this test. They are usually collected by inserting a needle into a blood vessel. How do I prepare for this test?  For 3 days before your test, eat normally. Have plenty of carbohydrate-rich foods.  Follow instructions from your health care provider about: ? Eating or drinking restrictions on the day of the test. You may be asked to not eat or drink anything other than water (fast) starting 8-10 hours before the test. ? Changing or stopping your regular medicines. Some medicines may interfere with this test. Tell a  health care provider about:  All medicines you are taking, including vitamins, herbs, eye drops, creams, and over-the-counter medicines.  Any blood disorders you have.  Any surgeries you have had.  Any medical conditions you have. What happens during the test? First, your blood glucose will be measured. This is referred to as your fasting blood glucose, since you fasted before the test. Then, you will drink a glucose solution that contains a certain amount of glucose. Your blood glucose will be measured again 1, 2, and 3 hours after drinking the solution. This test takes about 3 hours to complete. You will need to stay at the testing location during this time. During the testing period:  Do not eat or drink anything other than the glucose solution.  Do not exercise.  Do not use any products that contain nicotine or tobacco, such as cigarettes and e-cigarettes. If you need help stopping, ask your health care provider. The testing procedure may vary among health care providers and hospitals. How are the results reported? Your results will be reported as milligrams of glucose per deciliter of blood (mg/dL) or millimoles per liter (mmol/L). Your health care provider will compare your results to normal ranges that were established after testing a large group of people (reference ranges). Reference ranges may vary among labs and hospitals. For this test, common reference ranges are:  Fasting: less than 95-105 mg/dL (5.3-5.8 mmol/L).  1 hour   after drinking glucose: less than 180-190 mg/dL (10.0-10.5 mmol/L).  2 hours after drinking glucose: less than 155-165 mg/dL (8.6-9.2 mmol/L).  3 hours after drinking glucose: 140-145 mg/dL (7.8-8.1 mmol/L). What do the results mean? Results within reference ranges are considered normal, meaning that your glucose levels are well-controlled. If two or more of your blood glucose levels are high, you may be diagnosed with gestational diabetes. If only one  level is high, your health care provider may suggest repeat testing or other tests to confirm a diagnosis. Talk with your health care provider about what your results mean. Questions to ask your health care provider Ask your health care provider, or the department that is doing the test:  When will my results be ready?  How will I get my results?  What are my treatment options?  What other tests do I need?  What are my next steps? Summary  The glucose tolerance test (GTT) is one of several tests used to diagnose diabetes that develops during pregnancy (gestational diabetes mellitus). Gestational diabetes is a temporary form of diabetes that some women develop during pregnancy.  You may have the GTT test after having a 1-hour glucose screening test if the results from that test indicate that you may have gestational diabetes. You may also have this test if you have any symptoms or risk factors for gestational diabetes.  Talk with your health care provider about what your results mean. This information is not intended to replace advice given to you by your health care provider. Make sure you discuss any questions you have with your health care provider. Document Revised: 10/27/2018 Document Reviewed: 02/15/2017 Elsevier Patient Education  2020 Elsevier Inc.  

## 2019-08-23 NOTE — Lactation Note (Signed)
Lactation Consultation Note  Patient Name: LENEA BYWATER QUIVH'O Date: 08/23/2019     Maternal Data    Feeding    LATCH Score                   Interventions    Lactation Tools Discussed/Used     Consult Status   Lactation student discussed benefits of breastfeeding per the Ready, Set, Baby curriculum. Judge Stall encouraged to review breastfeeding information on Ready, set, Baby web site and given information for virtual breastfeeding classes.    Thersa Salt Katelin Kutsch 08/23/2019, 10:54 AM

## 2019-08-23 NOTE — Progress Notes (Signed)
ROB doing well. Feels good movement. 28 wk labs today. Glucose screen/RPR/CPC/Tdap/BTC. Discussed BC after baby, information given. Planning nexplanon. Sample birth plan given. Will follow next appointment. Breat pump from given for her to get a pump . Needs urine culutre next visit- hx uti . Follow up 2 wks with Marcelino Duster.   Doreene Burke, CNM

## 2019-08-24 ENCOUNTER — Other Ambulatory Visit: Payer: Self-pay | Admitting: Certified Nurse Midwife

## 2019-08-24 LAB — CBC
Hematocrit: 31.6 % — ABNORMAL LOW (ref 34.0–46.6)
Hemoglobin: 10.5 g/dL — ABNORMAL LOW (ref 11.1–15.9)
MCH: 29 pg (ref 26.6–33.0)
MCHC: 33.2 g/dL (ref 31.5–35.7)
MCV: 87 fL (ref 79–97)
Platelets: 218 10*3/uL (ref 150–450)
RBC: 3.62 x10E6/uL — ABNORMAL LOW (ref 3.77–5.28)
RDW: 13 % (ref 11.7–15.4)
WBC: 8.3 10*3/uL (ref 3.4–10.8)

## 2019-08-24 LAB — GLUCOSE, 1 HOUR GESTATIONAL: Gestational Diabetes Screen: 119 mg/dL (ref 65–139)

## 2019-08-24 LAB — RPR: RPR Ser Ql: NONREACTIVE

## 2019-08-24 MED ORDER — FUSION PLUS PO CAPS: 1.0000 | ORAL_CAPSULE | Freq: Every day | ORAL | 6 refills | Status: DC

## 2019-08-24 NOTE — Progress Notes (Signed)
Anemia noted at 28 wk labs , orders placed for fusion plus. Pt notified via my chart.   Doreene Burke, CNM

## 2019-08-30 DIAGNOSIS — F6381 Intermittent explosive disorder: Secondary | ICD-10-CM | POA: Diagnosis not present

## 2019-08-31 ENCOUNTER — Observation Stay
Admission: EM | Admit: 2019-08-31 | Discharge: 2019-08-31 | Disposition: A | Discharge status: Home / Self Care | Payer: Medicaid Other | Attending: Obstetrics and Gynecology | Admitting: Obstetrics and Gynecology

## 2019-08-31 ENCOUNTER — Encounter: Payer: Self-pay | Admitting: Obstetrics and Gynecology

## 2019-08-31 ENCOUNTER — Other Ambulatory Visit: Payer: Self-pay

## 2019-08-31 DIAGNOSIS — O2343 Unspecified infection of urinary tract in pregnancy, third trimester: Secondary | ICD-10-CM | POA: Diagnosis not present

## 2019-08-31 DIAGNOSIS — R1031 Right lower quadrant pain: Secondary | ICD-10-CM

## 2019-08-31 DIAGNOSIS — R102 Pelvic and perineal pain: Secondary | ICD-10-CM

## 2019-08-31 DIAGNOSIS — O99891 Other specified diseases and conditions complicating pregnancy: Secondary | ICD-10-CM

## 2019-08-31 DIAGNOSIS — Z3A29 29 weeks gestation of pregnancy: Secondary | ICD-10-CM | POA: Insufficient documentation

## 2019-08-31 HISTORY — DX: Anemia, unspecified: D64.9

## 2019-08-31 LAB — URINALYSIS, ROUTINE W REFLEX MICROSCOPIC
Bilirubin Urine: NEGATIVE
Glucose, UA: NEGATIVE mg/dL
Ketones, ur: NEGATIVE mg/dL
Nitrite: NEGATIVE
Protein, ur: 100 mg/dL — AB
RBC / HPF: >50 RBC/hpf — ABNORMAL HIGH (ref 0–5)
Specific Gravity, Urine: 1.012 (ref 1.005–1.030)
WBC, UA: >50 WBC/hpf — ABNORMAL HIGH (ref 0–5)
pH: 6 (ref 5.0–8.0)

## 2019-08-31 MED ORDER — NITROFURANTOIN MONOHYD MACRO 100 MG PO CAPS: 100.0000 mg | ORAL_CAPSULE | Freq: Two times a day (BID) | ORAL | 0 refills | Status: DC

## 2019-08-31 MED ORDER — OXYCODONE-ACETAMINOPHEN 5-325 MG PO TABS
2.0000 | ORAL_TABLET | Freq: Once | ORAL | Status: AC
  Administered 2019-08-31: 2 via ORAL

## 2019-08-31 MED ORDER — OXYCODONE-ACETAMINOPHEN 5-325 MG PO TABS
ORAL_TABLET | ORAL | Status: AC
  Filled 2019-08-31: qty 2

## 2019-08-31 NOTE — Progress Notes (Signed)
Pt. discharged home after being given pain medication, PO, along with a sandwich tray. She reports a decrease in N/V as well as a decrease in pain. Pt. Stable at this time.  Pt. discharged home with friend with plan to pick up antibiotics and AZO today. Pt instructed to drink plenty of water. Pt. And support person verbalized understanding of discharge instructions.

## 2019-08-31 NOTE — OB Triage Note (Signed)
Pt presented to L/D triage from home with reported abdominal pain that began last night. She took a hot shower and took ibuprofen (pt educated done on this), with no relief. Pain is sharp, constant, 8/10, and located on her right side that radiates to the back. No bleeding or LOF, positive fetal movement. Pt reports she "feels like she is getting a UTI" with some pain and pressure with urination noted.Constant urge to urinate. Pt stable at this time. Monitors applied and assessing.

## 2019-08-31 NOTE — Discharge Instructions (Signed)
DRINK PLENTY OF WATER! TAKE 2 DAY COURSE OF AZO FROM [facility]

## 2019-08-31 NOTE — Discharge Summary (Signed)
    L&D OB Triage Note  SUBJECTIVE Gabriela Anderson is a 19 y.o. G1P0 female at [redacted]w[redacted]d, EDD Estimated Date of Delivery: 11/11/19 who presented to triage with complaints of pelvic pain and some right lower quadrant pain.  States pain is constant not intermittent.  Reports normal fetal movement, denies vaginal bleeding or leakage of fluid.  OB History  Gravida Para Term Preterm AB Living  1 0 0 0 0 0  SAB TAB Ectopic Multiple Live Births  0 0 0 0 0    # Outcome Date GA Lbr Len/2nd Weight Sex Delivery Anes PTL Lv  1 Current             Medications Prior to Admission  Medication Sig Dispense Refill Last Dose  . albuterol (VENTOLIN HFA) 108 (90 Base) MCG/ACT inhaler Inhale 2 puffs into the lungs as needed.   08/30/2019 at Unknown time  . cetirizine (ZYRTEC) 10 MG tablet Take 10 mg by mouth as needed.   Past Month at Unknown time  . Prenatal Vit-Fe Fumarate-FA (MULTIVITAMIN-PRENATAL) 27-0.8 MG TABS tablet Take 1 tablet by mouth daily at 12 noon.   08/30/2019 at Unknown time  . Iron-FA-B Cmp-C-Biot-Probiotic (FUSION PLUS) CAPS Take 1 tablet by mouth daily. (Patient not taking: Reported on 08/31/2019) 30 capsule 6 Not Taking at Unknown time     OBJECTIVE  Nursing Evaluation:   BP 137/78 (BP Location: Right Arm)   Pulse 96   Temp 98.2 F (36.8 C) (Oral)   Resp (!) 30 Comment: pt states she is not feeling SOB  Ht 5\' 8"  (1.727 m)   Wt 96.2 kg   LMP 02/04/2019   SpO2 98%   BMI 32.23 kg/m    Findings:        No evidence of labor     UA and symptoms consistent with UTI      NST was performed and has been reviewed by me.  NST INTERPRETATION: Reassuring for gestational age  Mode: External Baseline Rate (A): 150 bpm(fht) Variability: Moderate Accelerations: 10 x 10 Decelerations: None     Contraction Frequency (min): UI  ASSESSMENT Impression:  1.  Pregnancy:  G1P0 at [redacted]w[redacted]d , EDD Estimated Date of Delivery: 11/11/19 2.  Reassuring fetal and maternal status 3.  UTI  PLAN 1.  Discussed current condition and above findings with patient and reassurance given.  All questions answered. 2. Discharge home with standard labor precautions given to return to L&D or call the office for problems. 3. Continue routine prenatal care. 4.  Treat with 7-day course of Macrobid and then recommend patient begin daily suppression with Macrobid for the remainder of pregnancy.  Also recommend close follow-up next week to make sure symptoms are improving.

## 2019-09-02 ENCOUNTER — Inpatient Hospital Stay
Admission: EM | Admit: 2019-09-02 | Discharge: 2019-09-04 | DRG: 832 | Disposition: A | Discharge status: Home / Self Care | Payer: Medicaid Other | Attending: Obstetrics and Gynecology | Admitting: Obstetrics and Gynecology

## 2019-09-02 ENCOUNTER — Other Ambulatory Visit: Payer: Self-pay

## 2019-09-02 ENCOUNTER — Observation Stay: Payer: Medicaid Other

## 2019-09-02 ENCOUNTER — Encounter: Payer: Self-pay | Admitting: Obstetrics and Gynecology

## 2019-09-02 DIAGNOSIS — E878 Other disorders of electrolyte and fluid balance, not elsewhere classified: Secondary | ICD-10-CM | POA: Diagnosis present

## 2019-09-02 DIAGNOSIS — O2303 Infections of kidney in pregnancy, third trimester: Secondary | ICD-10-CM | POA: Diagnosis not present

## 2019-09-02 DIAGNOSIS — O99891 Other specified diseases and conditions complicating pregnancy: Secondary | ICD-10-CM | POA: Diagnosis present

## 2019-09-02 DIAGNOSIS — M549 Dorsalgia, unspecified: Secondary | ICD-10-CM

## 2019-09-02 DIAGNOSIS — Z3A31 31 weeks gestation of pregnancy: Secondary | ICD-10-CM

## 2019-09-02 DIAGNOSIS — O99013 Anemia complicating pregnancy, third trimester: Secondary | ICD-10-CM | POA: Diagnosis present

## 2019-09-02 DIAGNOSIS — O211 Hyperemesis gravidarum with metabolic disturbance: Secondary | ICD-10-CM | POA: Diagnosis not present

## 2019-09-02 DIAGNOSIS — R509 Fever, unspecified: Secondary | ICD-10-CM

## 2019-09-02 DIAGNOSIS — O212 Late vomiting of pregnancy: Secondary | ICD-10-CM | POA: Diagnosis present

## 2019-09-02 DIAGNOSIS — E876 Hypokalemia: Secondary | ICD-10-CM | POA: Diagnosis present

## 2019-09-02 DIAGNOSIS — N1330 Unspecified hydronephrosis: Secondary | ICD-10-CM | POA: Diagnosis not present

## 2019-09-02 DIAGNOSIS — Z20822 Contact with and (suspected) exposure to covid-19: Secondary | ICD-10-CM | POA: Diagnosis present

## 2019-09-02 DIAGNOSIS — O219 Vomiting of pregnancy, unspecified: Secondary | ICD-10-CM | POA: Diagnosis present

## 2019-09-02 DIAGNOSIS — D649 Anemia, unspecified: Secondary | ICD-10-CM | POA: Diagnosis present

## 2019-09-02 DIAGNOSIS — M545 Low back pain: Secondary | ICD-10-CM

## 2019-09-02 DIAGNOSIS — Z3A30 30 weeks gestation of pregnancy: Secondary | ICD-10-CM

## 2019-09-02 DIAGNOSIS — O99283 Endocrine, nutritional and metabolic diseases complicating pregnancy, third trimester: Secondary | ICD-10-CM | POA: Diagnosis present

## 2019-09-02 LAB — COMPREHENSIVE METABOLIC PANEL
ALT: 19 U/L (ref 0–44)
AST: 30 U/L (ref 15–41)
Albumin: 3 g/dL — ABNORMAL LOW (ref 3.5–5.0)
Alkaline Phosphatase: 89 U/L (ref 38–126)
Anion gap: 13 (ref 5–15)
BUN: 5 mg/dL — ABNORMAL LOW (ref 6–20)
CO2: 17 mmol/L — ABNORMAL LOW (ref 22–32)
Calcium: 8.4 mg/dL — ABNORMAL LOW (ref 8.9–10.3)
Chloride: 101 mmol/L (ref 98–111)
Creatinine, Ser: 0.81 mg/dL (ref 0.44–1.00)
GFR calc Af Amer: >60 mL/min (ref >60)
GFR calc non Af Amer: >60 mL/min (ref >60)
Glucose, Bld: 82 mg/dL (ref 70–99)
Potassium: 3.2 mmol/L — ABNORMAL LOW (ref 3.5–5.1)
Sodium: 131 mmol/L — ABNORMAL LOW (ref 135–145)
Total Bilirubin: 0.8 mg/dL (ref 0.3–1.2)
Total Protein: 7 g/dL (ref 6.5–8.1)

## 2019-09-02 LAB — CBC
HCT: 29.9 % — ABNORMAL LOW (ref 36.0–46.0)
Hemoglobin: 9.9 g/dL — ABNORMAL LOW (ref 12.0–15.0)
MCH: 28.7 pg (ref 26.0–34.0)
MCHC: 33.1 g/dL (ref 30.0–36.0)
MCV: 86.7 fL (ref 80.0–100.0)
Platelets: 184 10*3/uL (ref 150–400)
RBC: 3.45 MIL/uL — ABNORMAL LOW (ref 3.87–5.11)
RDW: 12.7 % (ref 11.5–15.5)
WBC: 13.3 10*3/uL — ABNORMAL HIGH (ref 4.0–10.5)
nRBC: 0 % (ref 0.0–0.2)

## 2019-09-02 LAB — URINALYSIS, COMPLETE (UACMP) WITH MICROSCOPIC
Bilirubin Urine: NEGATIVE
Glucose, UA: NEGATIVE mg/dL
Hgb urine dipstick: NEGATIVE
Ketones, ur: 20 mg/dL — AB
Nitrite: NEGATIVE
Protein, ur: 100 mg/dL — AB
Specific Gravity, Urine: 1.009 (ref 1.005–1.030)
pH: 7 (ref 5.0–8.0)

## 2019-09-02 LAB — TYPE AND SCREEN
ABO/RH(D): O POS
Antibody Screen: NEGATIVE

## 2019-09-02 LAB — RESPIRATORY PANEL BY RT PCR (FLU A&B, COVID)
Influenza A by PCR: NEGATIVE
Influenza B by PCR: NEGATIVE
SARS Coronavirus 2 by RT PCR: NEGATIVE

## 2019-09-02 MED ORDER — ACETAMINOPHEN 325 MG PO TABS
650.0000 mg | ORAL_TABLET | Freq: Once | ORAL | Status: AC
  Administered 2019-09-02: 20:00:00 650 mg via ORAL

## 2019-09-02 MED ORDER — ACETAMINOPHEN 325 MG PO TABS
650.0000 mg | ORAL_TABLET | ORAL | Status: DC | PRN
  Administered 2019-09-02 – 2019-09-03 (×3): 650 mg via ORAL
  Filled 2019-09-02 (×3): qty 2

## 2019-09-02 MED ORDER — ACETAMINOPHEN 325 MG PO TABS
ORAL_TABLET | ORAL | Status: AC
  Filled 2019-09-02: qty 2

## 2019-09-02 MED ORDER — ONDANSETRON HCL 4 MG/2ML IJ SOLN
4.0000 mg | Freq: Three times a day (TID) | INTRAMUSCULAR | Status: DC
  Administered 2019-09-03 – 2019-09-04 (×2): 4 mg via INTRAVENOUS
  Filled 2019-09-02 (×2): qty 2

## 2019-09-02 MED ORDER — NITROFURANTOIN MONOHYD MACRO 100 MG PO CAPS
100.0000 mg | ORAL_CAPSULE | Freq: Two times a day (BID) | ORAL | Status: DC
  Administered 2019-09-02 – 2019-09-04 (×4): 100 mg via ORAL
  Filled 2019-09-02 (×5): qty 1

## 2019-09-02 MED ORDER — LACTATED RINGERS IV BOLUS
1000.0000 mL | Freq: Once | INTRAVENOUS | Status: AC
  Administered 2019-09-02: 17:00:00 1000 mL via INTRAVENOUS

## 2019-09-02 MED ORDER — SODIUM CHLORIDE 0.9 % IV SOLN
1.0000 g | INTRAVENOUS | Status: DC
  Administered 2019-09-03: 17:00:00 1 g via INTRAVENOUS
  Filled 2019-09-02 (×2): qty 10

## 2019-09-02 MED ORDER — SODIUM CHLORIDE 0.9 % IV SOLN: 1.0000 g | INTRAVENOUS | Status: DC

## 2019-09-02 MED ORDER — SODIUM CHLORIDE 0.9 % IV SOLN
INTRAVENOUS | Status: DC
  Administered 2019-09-04: 150 mL/h via INTRAVENOUS

## 2019-09-02 MED ORDER — POTASSIUM CHLORIDE CRYS ER 20 MEQ PO TBCR
20.0000 meq | EXTENDED_RELEASE_TABLET | Freq: Every day | ORAL | Status: DC
  Administered 2019-09-02 – 2019-09-03 (×2): 20 meq via ORAL
  Filled 2019-09-02 (×2): qty 2

## 2019-09-02 MED ORDER — FERROUS SULFATE 325 (65 FE) MG PO TABS
325.0000 mg | ORAL_TABLET | Freq: Two times a day (BID) | ORAL | Status: DC
  Administered 2019-09-03 – 2019-09-04 (×2): 325 mg via ORAL
  Filled 2019-09-02 (×2): qty 1

## 2019-09-02 MED ORDER — ZOLPIDEM TARTRATE 5 MG PO TABS: 5.0000 mg | ORAL_TABLET | Freq: Every evening | ORAL | Status: DC | PRN

## 2019-09-02 MED ORDER — ONDANSETRON HCL 4 MG/2ML IJ SOLN: 4.0000 mg | Freq: Four times a day (QID) | INTRAMUSCULAR | Status: DC | PRN

## 2019-09-02 MED ORDER — LACTATED RINGERS IV SOLN: INTRAVENOUS | Status: DC

## 2019-09-02 MED ORDER — SODIUM CHLORIDE 0.9 % IV SOLN
1.0000 g | Freq: Once | INTRAVENOUS | Status: AC
  Administered 2019-09-02: 18:00:00 1 g via INTRAVENOUS
  Filled 2019-09-02: qty 10

## 2019-09-02 MED ORDER — DOCUSATE SODIUM 100 MG PO CAPS
100.0000 mg | ORAL_CAPSULE | Freq: Every day | ORAL | Status: DC
  Administered 2019-09-03 – 2019-09-04 (×2): 100 mg via ORAL
  Filled 2019-09-02 (×2): qty 1

## 2019-09-02 MED ORDER — CALCIUM CARBONATE ANTACID 500 MG PO CHEW: 2.0000 | CHEWABLE_TABLET | ORAL | Status: DC | PRN

## 2019-09-02 MED ORDER — PRENATAL MULTIVITAMIN CH
1.0000 | ORAL_TABLET | Freq: Every day | ORAL | Status: DC
  Administered 2019-09-03 – 2019-09-04 (×2): 1 via ORAL
  Filled 2019-09-02 (×2): qty 1

## 2019-09-02 NOTE — H&P (Addendum)
Obstetric History and Physical  Gabriela Anderson is a 19 y.o. G1P0 with IUP at [redacted]w[redacted]d presenting for complaints of low back pain, nausea/vomiting since Wednesday evening.m. Of note, patient has a h/o recurrent UTI's, recently treated for a UTI this week with Macrobid after being seen in L&D triage on Thursday. She reports subjective fevers and chills, took temp at home this morning, was 100.1. Patient denies contractions, no vaginal bleeding, intact membranes, with active fetal movement.    Prenatal Course Source of Care: [facility] (midwifery care)  with onset of care at 8 weeks  Pregnancy complications or risks: Patient Active Problem List   Diagnosis Date Noted  . Nausea and vomiting during pregnancy 09/02/2019  . Pyelonephritis affecting pregnancy in third trimester 09/02/2019  . Indication for care in labor or delivery 08/31/2019    Prenatal labs and studies: ABO, Rh: O/Positive/-- (09/17 1507) Antibody: Negative (09/17 1513) Rubella: 11.60 (09/17 1507) RPR: Non Reactive (02/03 0938)  HBsAg: Negative (09/17 1507)  HIV: Non Reactive (09/17 1507)  GBS: unknown. 1 hr Glucola  Normal.  Genetic screening normal Anatomy US normal   Past Medical History:  Diagnosis Date  . Anemia   . Anxiety   . Asthma   . Depression     Past Surgical History:  Procedure Laterality Date  . MYRINGOTOMY    . NO PAST SURGERIES      OB History  Gravida Para Term Preterm AB Living  1            SAB TAB Ectopic Multiple Live Births               # Outcome Date GA Lbr Len/2nd Weight Sex Delivery Anes PTL Lv  1 Current              Social History   Socioeconomic History  . Marital status: Single    Spouse name: Not on file  . Number of children: Not on file  . Years of education: Not on file  . Highest education level: Not on file  Occupational History  . Not on file  Tobacco Use  . Smoking status: Never Smoker  . Smokeless tobacco: Never Used  Substance and Sexual  Activity  . Alcohol use: No  . Drug use: No  . Sexual activity: Yes    Comment: planning nexplanon  Other Topics Concern  . Not on file  Social History Narrative  . Not on file   Social Determinants of Health   Financial Resource Strain:   . Difficulty of Paying Living Expenses: Not on file  Food Insecurity:   . Worried About Programme researcher, broadcasting/film/video in the Last Year: Not on file  . Ran Out of Food in the Last Year: Not on file  Transportation Needs:   . Lack of Transportation (Medical): Not on file  . Lack of Transportation (Non-Medical): Not on file  Physical Activity:   . Days of Exercise per Week: Not on file  . Minutes of Exercise per Session: Not on file  Stress:   . Feeling of Stress : Not on file  Social Connections:   . Frequency of Communication with Friends and Family: Not on file  . Frequency of Social Gatherings with Friends and Family: Not on file  . Attends Religious Services: Not on file  . Active Member of Clubs or Organizations: Not on file  . Attends Banker Meetings: Not on file  . Marital Status: Not on file  No family history on file.  Medications Prior to Admission  Medication Sig Dispense Refill Last Dose  . acetaminophen (TYLENOL) 500 MG tablet Take 1,000 mg by mouth every 6 (six) hours as needed for mild pain.   09/02/2019 at Unknown time  . albuterol (VENTOLIN HFA) 108 (90 Base) MCG/ACT inhaler Inhale 2 puffs into the lungs as needed.   09/02/2019 at Unknown time  . nitrofurantoin, macrocrystal-monohydrate, (MACROBID) 100 MG capsule Take 1 capsule (100 mg total) by mouth 2 (two) times daily for 7 days. 14 capsule 0 09/02/2019 at Unknown time  . Prenatal Vit-Fe Fumarate-FA (MULTIVITAMIN-PRENATAL) 27-0.8 MG TABS tablet Take 1 tablet by mouth daily at 12 noon.   09/02/2019 at Unknown time  . cetirizine (ZYRTEC) 10 MG tablet Take 10 mg by mouth as needed.   Not Taking at Unknown time    No Known Allergies  Review of Systems: Negative except  for what is mentioned in HPI.  Physical Exam: Vitals:   09/02/19 2011 09/02/19 2040 09/02/19 2046 09/02/19 2132  BP:   (!) 105/55   Pulse:   (!) 141 92  Resp:   18 16  Temp:   101.1 98.6 F (37 C)  TempSrc:      SpO2: 100% 100%    Weight:      Height:        CONSTITUTIONAL: Well-developed, well-nourished female in no acute distress.  HENT:  Normocephalic, atraumatic, External right and left ear normal. Oropharynx is clear and moist EYES: Conjunctivae and EOM are normal. Pupils are equal, round, and reactive to light. No scleral icterus.  NECK: Normal range of motion, supple, no masses SKIN: Skin is warm and dry. No rash noted. Not diaphoretic. No erythema. No pallor. NEUROLOGIC: Alert and oriented to person, place, and time. Normal reflexes, muscle tone coordination. No cranial nerve deficit noted. PSYCHIATRIC: Normal mood and affect. Normal behavior. Normal judgment and thought content. CARDIOVASCULAR: Normal heart rate noted, regular rhythm RESPIRATORY: Effort and breath sounds normal, no problems with respiration noted ABDOMEN: Soft, nontender, nondistended, gravid. BACK: mild CVA tenderness bilaterally.  MUSCULOSKELETAL: Normal range of motion. No edema and no tenderness. 2+ distal pulses.   FHT:  Baseline rate 170 bpm   Variability moderate  Accelerations present   Decelerations none Contractions: Every 2-4 mins (undetectable by patient)    Pertinent Labs/Studies:   Results for orders placed or performed during the hospital encounter of 09/02/19 (from the past 24 hour(s))  Respiratory Panel by RT PCR (Flu A&B, Covid) - Nasopharyngeal Swab     Status: None   Collection Time: 09/02/19  5:18 PM   Specimen: Nasopharyngeal Swab  Result Value Ref Range   SARS Coronavirus 2 by RT PCR NEGATIVE NEGATIVE   Influenza A by PCR NEGATIVE NEGATIVE   Influenza B by PCR NEGATIVE NEGATIVE  Urinalysis, Complete w Microscopic     Status: Abnormal   Collection Time: 09/02/19  5:18 PM   Result Value Ref Range   Color, Urine YELLOW (A) YELLOW   APPearance CLEAR (A) CLEAR   Specific Gravity, Urine 1.009 1.005 - 1.030   pH 7.0 5.0 - 8.0   Glucose, UA NEGATIVE NEGATIVE mg/dL   Hgb urine dipstick NEGATIVE NEGATIVE   Bilirubin Urine NEGATIVE NEGATIVE   Ketones, ur 20 (A) NEGATIVE mg/dL   Protein, ur 100 (A) NEGATIVE mg/dL   Nitrite NEGATIVE NEGATIVE   Leukocytes,Ua SMALL (A) NEGATIVE   RBC / HPF 0-5 0 - 5 RBC/hpf   WBC, UA 6-10 0 -  5 WBC/hpf   Bacteria, UA FEW (A) NONE SEEN   Squamous Epithelial / LPF 0-5 0 - 5   Mucus PRESENT   CBC     Status: Abnormal   Collection Time: 09/02/19  6:09 PM  Result Value Ref Range   WBC 13.3 (H) 4.0 - 10.5 K/uL   RBC 3.45 (L) 3.87 - 5.11 MIL/uL   Hemoglobin 9.9 (L) 12.0 - 15.0 g/dL   HCT 68.3 (L) 41.9 - 62.2 %   MCV 86.7 80.0 - 100.0 fL   MCH 28.7 26.0 - 34.0 pg   MCHC 33.1 30.0 - 36.0 g/dL   RDW 29.7 98.9 - 21.1 %   Platelets 184 150 - 400 K/uL   nRBC 0.0 0.0 - 0.2 %  Comprehensive metabolic panel     Status: Abnormal   Collection Time: 09/02/19  6:09 PM  Result Value Ref Range   Sodium 131 (L) 135 - 145 mmol/L   Potassium 3.2 (L) 3.5 - 5.1 mmol/L   Chloride 101 98 - 111 mmol/L   CO2 17 (L) 22 - 32 mmol/L   Glucose, Bld 82 70 - 99 mg/dL   BUN 5 (L) 6 - 20 mg/dL   Creatinine, Ser 9.41 0.44 - 1.00 mg/dL   Calcium 8.4 (L) 8.9 - 10.3 mg/dL   Total Protein 7.0 6.5 - 8.1 g/dL   Albumin 3.0 (L) 3.5 - 5.0 g/dL   AST 30 15 - 41 U/L   ALT 19 0 - 44 U/L   Alkaline Phosphatase 89 38 - 126 U/L   Total Bilirubin 0.8 0.3 - 1.2 mg/dL   GFR calc non Af Amer >60 >60 mL/min   GFR calc Af Amer >60 >60 mL/min   Anion gap 13 5 - 15    Assessment : KAYTLAN BEHRMAN is a 19 y.o. G1P0 at [redacted]w[redacted]d being admitted for suspected pyelonephritis. History of recurrent UTI's. COVID testing negative.   Plan: - Admit to Antepartum service - UA wnl but recently diagnosed with UTI 3-4 days ago, treating with Macrobid. Patient treated with IV Rocephin.  Continue q 12 dosing.  - Nausea and vomiting, will treat with Zofran. Diet as tolerated.  - Tylenol prn for fevers, pain - Activity as tolerated.  - IVF bolus, followed by maintenance fluids.  - Fetal status with tachycardia, will continue to monitor after interventions administered. Overall otherwise reassuring.  - Renal ultrasound ordered to r/o renal stones.  - Anemia of pregnancy, can treat with PO iron once tolerating PO, can also offer dose of IV iron while inpatient.    Hildred Laser, MD [facility]

## 2019-09-02 NOTE — OB Triage Note (Signed)
Having difficulty keeping anything down since Thursday. Gabriela Anderson

## 2019-09-03 DIAGNOSIS — O212 Late vomiting of pregnancy: Secondary | ICD-10-CM | POA: Diagnosis not present

## 2019-09-03 DIAGNOSIS — N10 Acute tubulo-interstitial nephritis: Secondary | ICD-10-CM | POA: Diagnosis not present

## 2019-09-03 DIAGNOSIS — O99013 Anemia complicating pregnancy, third trimester: Secondary | ICD-10-CM | POA: Diagnosis not present

## 2019-09-03 DIAGNOSIS — O99283 Endocrine, nutritional and metabolic diseases complicating pregnancy, third trimester: Secondary | ICD-10-CM | POA: Diagnosis not present

## 2019-09-03 DIAGNOSIS — E878 Other disorders of electrolyte and fluid balance, not elsewhere classified: Secondary | ICD-10-CM | POA: Diagnosis present

## 2019-09-03 DIAGNOSIS — O99891 Other specified diseases and conditions complicating pregnancy: Secondary | ICD-10-CM | POA: Diagnosis not present

## 2019-09-03 DIAGNOSIS — O2303 Infections of kidney in pregnancy, third trimester: Secondary | ICD-10-CM | POA: Diagnosis not present

## 2019-09-03 DIAGNOSIS — E876 Hypokalemia: Secondary | ICD-10-CM | POA: Diagnosis not present

## 2019-09-03 DIAGNOSIS — O211 Hyperemesis gravidarum with metabolic disturbance: Secondary | ICD-10-CM | POA: Diagnosis not present

## 2019-09-03 DIAGNOSIS — M545 Low back pain: Secondary | ICD-10-CM | POA: Diagnosis not present

## 2019-09-03 DIAGNOSIS — Z20822 Contact with and (suspected) exposure to covid-19: Secondary | ICD-10-CM | POA: Diagnosis not present

## 2019-09-03 DIAGNOSIS — N1330 Unspecified hydronephrosis: Secondary | ICD-10-CM | POA: Diagnosis not present

## 2019-09-03 DIAGNOSIS — Z3A30 30 weeks gestation of pregnancy: Secondary | ICD-10-CM | POA: Diagnosis not present

## 2019-09-03 DIAGNOSIS — D649 Anemia, unspecified: Secondary | ICD-10-CM | POA: Diagnosis not present

## 2019-09-03 LAB — CBC WITH DIFFERENTIAL/PLATELET
Abs Immature Granulocytes: 0.22 10*3/uL — ABNORMAL HIGH (ref 0.00–0.07)
Basophils Absolute: 0 10*3/uL (ref 0.0–0.1)
Basophils Relative: 0 %
Eosinophils Absolute: 0 10*3/uL (ref 0.0–0.5)
Eosinophils Relative: 0 %
HCT: 27.1 % — ABNORMAL LOW (ref 36.0–46.0)
Hemoglobin: 9.2 g/dL — ABNORMAL LOW (ref 12.0–15.0)
Immature Granulocytes: 2 %
Lymphocytes Relative: 10 %
Lymphs Abs: 1.4 10*3/uL (ref 0.7–4.0)
MCH: 29.1 pg (ref 26.0–34.0)
MCHC: 33.9 g/dL (ref 30.0–36.0)
MCV: 85.8 fL (ref 80.0–100.0)
Monocytes Absolute: 1.6 10*3/uL — ABNORMAL HIGH (ref 0.1–1.0)
Monocytes Relative: 11 %
Neutro Abs: 10.9 10*3/uL — ABNORMAL HIGH (ref 1.7–7.7)
Neutrophils Relative %: 77 %
Platelets: 178 10*3/uL (ref 150–400)
RBC: 3.16 MIL/uL — ABNORMAL LOW (ref 3.87–5.11)
RDW: 12.9 % (ref 11.5–15.5)
WBC: 14.1 10*3/uL — ABNORMAL HIGH (ref 4.0–10.5)
nRBC: 0 % (ref 0.0–0.2)

## 2019-09-03 LAB — BASIC METABOLIC PANEL
Anion gap: 7 (ref 5–15)
BUN: <5 mg/dL — ABNORMAL LOW (ref 6–20)
CO2: 19 mmol/L — ABNORMAL LOW (ref 22–32)
Calcium: 8.1 mg/dL — ABNORMAL LOW (ref 8.9–10.3)
Chloride: 104 mmol/L (ref 98–111)
Creatinine, Ser: 0.81 mg/dL (ref 0.44–1.00)
GFR calc Af Amer: >60 mL/min (ref >60)
GFR calc non Af Amer: >60 mL/min (ref >60)
Glucose, Bld: 108 mg/dL — ABNORMAL HIGH (ref 70–99)
Potassium: 3.2 mmol/L — ABNORMAL LOW (ref 3.5–5.1)
Sodium: 130 mmol/L — ABNORMAL LOW (ref 135–145)

## 2019-09-03 LAB — ABO/RH: ABO/RH(D): O POS

## 2019-09-03 MED ORDER — SODIUM CHLORIDE 0.9 % IV SOLN
510.0000 mg | Freq: Once | INTRAVENOUS | Status: AC
  Administered 2019-09-03: 08:00:00 510 mg via INTRAVENOUS
  Filled 2019-09-03: qty 17

## 2019-09-03 NOTE — Progress Notes (Signed)
Antenatal Progress Note  Subjective:     Patient ID: Gabriela Anderson is a 19 y.o. female [redacted]w[redacted]d, Estimated Date of Delivery: 11/11/19 who was admitted for pyelonephritis.  HD# 1.   Subjective:  Patient reports intermittent cramping in her hands and legs. Otherwise doing ok. Has been tolerating soft foods (fruit, apple sauce) and graham crackers. Denies any more fevers since admission.    Review of Systems Denies contractions, leakage of fluids, vaginal bleeding, and reports good fetal movement.      Objective:  Temp:  [98.2 F (36.8 C)-101.3 F (38.5 C)] 98.2 F (36.8 C) (02/14 0817) Pulse Rate:  [92-141] 130 (02/14 0818) Resp:  [16-20] 16 (02/14 0817) BP: (105-131)/(55-67) 131/67 (02/14 0818) SpO2:  [98 %-100 %] 100 % (02/13 2040) Weight:  [96.2 kg] 96.2 kg (02/13 1641)   General appearance: alert and no distress Lungs: clear to auscultation bilaterally Heart: tachycardia present. Normal rhythm, S1, S2 normal, no murmur, click, rub or gallop Abdomen: soft, non-tender; bowel sounds normal; no masses,  no organomegaly. Back: no CVA tenderness.  Pelvic: deferred  Extremities: extremities normal, atraumatic, no cyanosis or edema   FHT:170 bpm.     Labs:  Lab Results  Component Value Date   WBC 13.3 (H) 09/02/2019   HGB 9.9 (L) 09/02/2019   HCT 29.9 (L) 09/02/2019   MCV 86.7 09/02/2019   PLT 184 09/02/2019    CMP     Component Value Date/Time   NA 131 (L) 09/02/2019 1809   K 3.2 (L) 09/02/2019 1809   CL 101 09/02/2019 1809   CO2 17 (L) 09/02/2019 1809   GLUCOSE 82 09/02/2019 1809   BUN 5 (L) 09/02/2019 1809   CREATININE 0.81 09/02/2019 1809   CALCIUM 8.4 (L) 09/02/2019 1809   PROT 7.0 09/02/2019 1809   ALBUMIN 3.0 (L) 09/02/2019 1809   AST 30 09/02/2019 1809   ALT 19 09/02/2019 1809   ALKPHOS 89 09/02/2019 1809   BILITOT 0.8 09/02/2019 1809   GFRNONAA >60 09/02/2019 1809   GFRAA >60 09/02/2019 1809     (Imaging):  US RENAL CLINICAL DATA:  Back pain,  third trimester pregnancy  EXAM: RENAL / URINARY TRACT ULTRASOUND COMPLETE  COMPARISON:  None.  FINDINGS: Right Kidney:  Renal measurements: 14.2 by 7 x 5.7 cm = volume: 294.6 mL. Cortex is slightly echogenic. Moderate hydronephrosis.  Left Kidney:  Renal measurements: 12 x 7.4 x 7.7 cm = volume: 380.5 mL. No mass. Mild to moderate left hydronephrosis.  Bladder:  Appears normal for degree of bladder distention.  Other:  None.  IMPRESSION: Bilateral hydronephrosis, moderate on the right and mild to moderate on the left. Right kidney cortex appears slightly echogenic, this is a nonspecific finding and could be seen in the setting of pyelonephritis or other medical renal disease.  Electronically Signed   By: Donavan Foil M.D.   On: 09/02/2019 21:28    Assessment:  19 y.o. female [redacted]w[redacted]d, with:    Patient Active Problem List   Diagnosis Date Noted  . Electrolyte disturbance 09/03/2019  . Anemia of pregnancy in third trimester 09/03/2019  . Nausea and vomiting during pregnancy 09/02/2019  . Pyelonephritis affecting pregnancy in third trimester 09/02/2019    Plan:   1. Pyelonephritis in third trimester -  Will continue Rocephin 1 gram q 24 hrs until 24 hrs afebrile.   - Urine culture pending - Patient was on course of Macrobid just prior to admission. Will continue course while inpatient.  - Renal ultrasound  noting bilateral hydronephrosis, however more prominent on right. Also may be suggestive of pyelonephritis.  - Tylenol prn for pain and fevers.   2. Nausea and vomiting - Continue Zofran for management.  Patient able to tolerate soft foods.  - Continue IVF at maintenance rate to improve hydration - Mild electrolyte disturbances noted, will replace (given oral potassium yesterday.  3. Anemia of pregnancy - Administered IV iron infusion (Feraheme) in light of patient's anemia and h/o nausea and vomiting. Once tolerating PO fully, can continue PO  supplementation.   4. Pregnancy at [redacted] weeks gestation - Fetal heart tones obtained but tachycardia present. Will order NST.  - Early ambulation, no need for DVT prophylaxis.  - PNV  Dispo: possible d/c home later this evening if remains afebrile and VS stable after second dose of IV Rocephin. Otherwise, can d/c home tomorrow.      Hildred Laser, MD [facility]

## 2019-09-04 ENCOUNTER — Encounter: Payer: Self-pay | Admitting: Obstetrics and Gynecology

## 2019-09-04 LAB — CBC WITH DIFFERENTIAL/PLATELET
Abs Immature Granulocytes: 0.18 10*3/uL — ABNORMAL HIGH (ref 0.00–0.07)
Basophils Absolute: 0 10*3/uL (ref 0.0–0.1)
Basophils Relative: 0 %
Eosinophils Absolute: 0 10*3/uL (ref 0.0–0.5)
Eosinophils Relative: 0 %
HCT: 27.4 % — ABNORMAL LOW (ref 36.0–46.0)
Hemoglobin: 9.2 g/dL — ABNORMAL LOW (ref 12.0–15.0)
Immature Granulocytes: 2 %
Lymphocytes Relative: 14 %
Lymphs Abs: 1.3 10*3/uL (ref 0.7–4.0)
MCH: 28.8 pg (ref 26.0–34.0)
MCHC: 33.6 g/dL (ref 30.0–36.0)
MCV: 85.9 fL (ref 80.0–100.0)
Monocytes Absolute: 1 10*3/uL (ref 0.1–1.0)
Monocytes Relative: 11 %
Neutro Abs: 6.6 10*3/uL (ref 1.7–7.7)
Neutrophils Relative %: 73 %
Platelets: 187 10*3/uL (ref 150–400)
RBC: 3.19 MIL/uL — ABNORMAL LOW (ref 3.87–5.11)
RDW: 13.1 % (ref 11.5–15.5)
WBC: 9 10*3/uL (ref 4.0–10.5)
nRBC: 0 % (ref 0.0–0.2)

## 2019-09-04 LAB — URINE CULTURE

## 2019-09-04 LAB — RESPIRATORY PANEL BY RT PCR (FLU A&B, COVID)
Influenza A by PCR: NEGATIVE
Influenza B by PCR: NEGATIVE
SARS Coronavirus 2 by RT PCR: NEGATIVE

## 2019-09-04 MED ORDER — ONDANSETRON 4 MG PO TBDP: 4.0000 mg | ORAL_TABLET | Freq: Four times a day (QID) | ORAL | Status: DC | PRN

## 2019-09-04 MED ORDER — ONDANSETRON HCL 4 MG PO TABS: 4.0000 mg | ORAL_TABLET | Freq: Every day | ORAL | 1 refills | Status: DC | PRN

## 2019-09-04 MED ORDER — POTASSIUM CHLORIDE CRYS ER 20 MEQ PO TBCR
40.0000 meq | EXTENDED_RELEASE_TABLET | Freq: Once | ORAL | Status: AC
  Administered 2019-09-04: 12:00:00 40 meq via ORAL
  Filled 2019-09-04: qty 2

## 2019-09-04 MED ORDER — NITROFURANTOIN MONOHYD MACRO 100 MG PO CAPS: 100.0000 mg | ORAL_CAPSULE | Freq: Every day | ORAL | 0 refills | Status: DC

## 2019-09-04 MED ORDER — FERROUS SULFATE 325 (65 FE) MG PO TABS: 325.0000 mg | ORAL_TABLET | Freq: Two times a day (BID) | ORAL | 1 refills | Status: DC

## 2019-09-04 MED ORDER — DOCUSATE SODIUM 100 MG PO CAPS: 100.0000 mg | ORAL_CAPSULE | Freq: Two times a day (BID) | ORAL | 2 refills | Status: DC | PRN

## 2019-09-04 NOTE — Discharge Instructions (Signed)
Pyelonephritis During Pregnancy ° °What are the causes? °This condition is caused by a bacterial infection in the lower urinary tract that spreads to the kidney. °What increases the risk? °You are more likely to develop this condition if: °· You have diabetes. °· You have a history of frequent urinary tract infections. °What are the signs or symptoms? °Symptoms of this condition may begin with symptoms of a lower urinary tract infection. These may include: °· A frequent urge to pass urine. °· Burning pain when passing urine. °· Pain and pressure in your lower abdomen. °· Blood in your urine. °· Cloudy or smelly urine. °As the infection spreads to your kidney, you may have these symptoms: °· Fever. °· Chills. °· Pain and tenderness in your upper abdomen or in your back and sides (flank pain). Flank pain often affects one side of the body, usually the right side. °· Nausea, vomiting, or loss of appetite. °How is this diagnosed? °This condition may be diagnosed based on: °· Your symptoms and medical history. °· A physical exam. °· Tests to confirm the diagnosis. These may include: °? Blood tests to check kidney function and look for signs of infection. °? Urine tests to check for signs of infection, including bacteria, white or red blood cells, and protein. °? Tests to grow and identify the type of bacteria that is causing the infection (urine culture). °? Imaging studies of your kidneys to learn more about your condition. °How is this treated? °This condition is treated in the hospital with antibiotics that are given into one of your veins through an IV. Your health care provider: °· Will start you on an antibiotic that is effective against common urinary tract infections. °· May switch to another antibiotic if the results of your urine culture show that your infection is caused by different bacteria. °· Will be careful to choose antibiotics that are the safest during pregnancy. °Other treatments may include: °· IV  fluids if you are nauseous and not able to drink fluids. °· Pain and fever medicines. °· Medicines for nausea and vomiting. °You will be able to go home when your infection is under control. To prevent another infection, you may need to continue taking antibiotics by mouth until your baby is born. °Follow these instructions at home: °Medicines ° °· Take over-the-counter and prescription medicines only as told by your health care provider. °· Take your antibiotic medicine as told by your health care provider. Do not stop taking the antibiotic even if you start to feel better. °· Continue to take your prenatal vitamins. °Lifestyle ° °· Follow instructions from your health care provider about eating or drinking restrictions. You may need to avoid: °? Foods and drinks with added sugar. °? Caffeine and fruit juice. °· Drink enough fluid to keep your urine pale yellow. °· Go to the bathroom frequently. Do not hold your urine. Try to empty your bladder completely. °· Change your underwear every day. Wear all-cotton underwear. Do not wear tight underwear or pants. °General instructions ° °· Take these steps to lower the risk of bacteria getting into your urinary tract: °? Use liquid soap instead of bar soap when showering or bathing. Bacteria can grow on bar soap. °? When you wash yourself, clean the urethra opening first. Use a washcloth to clean the area between your vagina and anus. Pat the area dry with a clean towel. °? Wash your hands before and after you go to the bathroom. °? Wipe yourself from front to back   after going to the bathroom. °? Do not use douches, perfumed soap, creams, or powders. °? Do not soak in a bath for more than 30 minutes. °· Return to your normal activities as told by your health care provider. Ask your health care provider what activities are safe for you. °· Keep all follow-up visits as told by your health care provider. This is important. °Contact a health care provider if: °· You have  chills or a fever. °· You have any symptoms of infection that do not get better at home. °· Symptoms of infection come back. °· You have a reaction or side effects from your antibiotic. °Get help right away if: °· You start having contractions. °Summary °· Pyelonephritis is an infection of the kidney or kidneys. °· This condition results when a bacterial infection in the lower urinary tract spreads to the kidney. °· Lower urinary tract infections are common during pregnancy. °· Pyelonephritis causes chills, a fever, flank pain, and nausea. °· Pyelonephritis is a serious infection that is usually treated in the hospital with IV antibiotics. °This information is not intended to replace advice given to you by your health care provider. Make sure you discuss any questions you have with your health care provider. °Document Revised: 10/28/2018 Document Reviewed: 10/07/2017 °Elsevier Patient Education © 2020 [employer] ° °

## 2019-09-04 NOTE — Progress Notes (Addendum)
Antenatal Progress Note  Subjective:     Patient ID: Gabriela Anderson is a 19 y.o. female [redacted]w[redacted]d, Estimated Date of Delivery: 11/11/19 who was admitted for pyelonephritis.  HD# 2.   Subjective:  Patient reports headache this morning.  Also reporting some chills. States that her milk tastes different (sweet today).   Still does not have much of an appetite. Does not complain of subjective fevers.   Review of Systems Denies contractions, leakage of fluids, vaginal bleeding, and reports good fetal movement.     Objective:  Temp:  [97.7 F (36.5 C)-99.3 F (37.4 C)] 99.2 F (37.3 C) (02/15 0737) Pulse Rate:  [105-122] 105 (02/15 0737) Resp:  [18] 18 (02/15 0737) BP: (119-131)/(63-80) 123/63 (02/15 0737) SpO2:  [99 %-100 %] 100 % (02/15 0737)   General appearance: alert and no distress Lungs: clear to auscultation bilaterally Heart: mild tachycardia present. Normal rhythm, S1, S2 normal, no murmur, click, rub or gallop Abdomen: soft, non-tender; bowel sounds normal; no masses,  no organomegaly. Back: no CVA tenderness.  Pelvic: deferred  Extremities: extremities normal, atraumatic, no cyanosis or edema   FHT:165 bpm.     Labs:  CBC Latest Ref Rng & Units 09/04/2019 09/03/2019 09/02/2019  WBC 4.0 - 10.5 K/uL 9.0 14.1(H) 13.3(H)  Hemoglobin 12.0 - 15.0 g/dL 8.4(Z) 6.6(A) 6.3(K)  Hematocrit 36.0 - 46.0 % 27.4(L) 27.1(L) 29.9(L)  Platelets 150 - 400 K/uL 187 178 184    CMP     Component Value Date/Time   NA 130 (L) 09/03/2019 1225   K 3.2 (L) 09/03/2019 1225   CL 104 09/03/2019 1225   CO2 19 (L) 09/03/2019 1225   GLUCOSE 108 (H) 09/03/2019 1225   BUN <5 (L) 09/03/2019 1225   CREATININE 0.81 09/03/2019 1225   CALCIUM 8.1 (L) 09/03/2019 1225   PROT 7.0 09/02/2019 1809   ALBUMIN 3.0 (L) 09/02/2019 1809   AST 30 09/02/2019 1809   ALT 19 09/02/2019 1809   ALKPHOS 89 09/02/2019 1809   BILITOT 0.8 09/02/2019 1809   GFRNONAA >60 09/03/2019 1225   GFRAA >60 09/03/2019 1225     Results for orders placed or performed during the hospital encounter of 09/02/19  Respiratory Panel by RT PCR (Flu A&B, Covid) - Nasopharyngeal Swab   Specimen: Nasopharyngeal Swab  Result Value Ref Range   SARS Coronavirus 2 by RT PCR NEGATIVE NEGATIVE   Influenza A by PCR NEGATIVE NEGATIVE   Influenza B by PCR NEGATIVE NEGATIVE  Urine Culture   Specimen: Urine, Random  Result Value Ref Range   Specimen Description      URINE, RANDOM Performed at [facility], [address], [place], [place] [address]    Special Requests NONE    Culture MULTIPLE SPECIES PRESENT, SUGGEST RECOLLECTION (A)    Report Status 09/04/2019 FINAL   Type and screen [facility]  Result Value Ref Range   ABO/RH(D) O POS    Antibody Screen NEG    Sample Expiration      09/05/2019,2359 Performed at [facility], [address] [place], [place] [address]   ABO/Rh  Result Value Ref Range   ABO/RH(D)      O POS Performed at [facility], [address] [place], [place] [address]     (Imaging):  US RENAL CLINICAL DATA:  Back pain, third trimester pregnancy  EXAM: RENAL / URINARY TRACT ULTRASOUND COMPLETE  COMPARISON:  None.  FINDINGS: Right Kidney:  Renal measurements: 14.2 by 7 x  5.7 cm = volume: 294.6 mL. Cortex is slightly echogenic. Moderate hydronephrosis.  Left Kidney:  Renal measurements: 12 x 7.4 x 7.7 cm = volume: 380.5 mL. No mass. Mild to moderate left hydronephrosis.  Bladder:  Appears normal for degree of bladder distention.  Other:  None.  IMPRESSION: Bilateral hydronephrosis, moderate on the right and mild to moderate on the left. Right kidney cortex appears slightly echogenic, this is a nonspecific finding and could be seen in the setting of pyelonephritis or other medical renal disease.  Electronically Signed   By: Donavan Foil M.D.   On: 09/02/2019 21:28    Assessment:  19 y.o. G1P0 female at [redacted]w[redacted]d,  with:    Patient Active Problem List   Diagnosis Date Noted  . Electrolyte disturbance 09/03/2019  . Anemia of pregnancy in third trimester 09/03/2019  . Nausea and vomiting during pregnancy 09/02/2019  . Pyelonephritis affecting pregnancy in third trimester 09/02/2019    Plan:   1. Pyelonephritis in third trimester -  Will continue Rocephin 1 gram q 24 hrs   - Urine culture inconclusive, suggesting recollection. Howeever - Patient was on course of Macrobid just prior to admission. Will continue course while inpatient.  - Renal ultrasound noting bilateral hydronephrosis, however more prominent on right. Also may be suggestive of pyelonephritis.  - Tylenol prn for pain and fevers.   2. Nausea and vomiting - Continue Zofran prn for management.  Patient able to tolerate soft foods but appetite still limited.  Noting changes in taste. - Will d/c IVF as patient tolerating PO.  - Mild electrolyte disturbances noted, will replace (continue potassium supplementation).  3. Anemia of pregnancy - Administered IV iron infusion (Feraheme) in light of patient's anemia yesterday. Once tolerating PO fully, can continue PO supplementation.   4. Pregnancy at [redacted] weeks gestation - Fetal heart tones obtained but tachycardia present. Will order NST.  - Early ambulation, no need for DVT prophylaxis.  - PNV  5. Patient with new onset headache, taste changes, chills.  Still with low-grade fevers. Initial COVID testing negative, but in light of new symptoms, will retest.  6. Pending labs, patient may be able to d/c home today.       Rubie Maid, MD [facility]

## 2019-09-04 NOTE — Progress Notes (Signed)
Pt discharged home. Discharge instructions, prescriptions, and follow up appointments given to and reviewed with pt. Pt verbalized understanding. To be escorted by axillary.  

## 2019-09-04 NOTE — Discharge Summary (Addendum)
OB Discharge Summary     Patient Name: Gabriela Anderson DOB: 29-Oct-2000 MRN: [id number]  Date of admission: 09/02/2019 Admitting MD: Hildred Laser, MD  Date of discharge: 09/04/2019  Admitting diagnosis: Nausea and vomiting during pregnancy [O21.9] Pyelonephritis affecting pregnancy in third trimester [O23.03]    Intrauterine pregnancy: [redacted]w[redacted]d     Secondary diagnosis:   Electrolyte disturbance   Anemia of pregnancy in third trimester  Additional problems: None     Discharge diagnosis: Pyelonephritis in pregnancy, mild hypokalemia, anemia of pregnancy.                                                                                              Hospital course: The patient was admitted to the hospital due to febrile morbidity, nausea/vomiting, back pain, with history of recent urinary tract infection.  She was diagnosed with pyelonephritis, and treated with IV Rocephin 1 g q 24 hrs (in addition to continuing her Macrobid), Tylenol for fevers/pain, and Zofran for her nausea/vomiting.  By HD#2 she was able to tolerate soft foods, remained afebrile, and pain had resolved. She still remained mildly tachycardic intermittently, but patient relatively remained asymptomatic. Urine culture was inconclusive but WBC count responded to antibiotic therapy.  COVID screening was negative. She was given IV FereHeme for treatment of anemia and PO potassium supplementation. She was deemed stable for discharge.   Physical exam  Vitals:   09/03/19 1919 09/03/19 2246 09/04/19 0402 09/04/19 0737  BP: 120/77 119/67 131/80 123/63  Pulse: (!) 111 (!) 108 (!) 111 (!) 105  Resp: 18 18 18 18   Temp: 99 F (37.2 C) 98.6 F (37 C) 97.7 F (36.5 C) 99.2 F (37.3 C)  TempSrc: Oral Oral Oral Oral  SpO2: 100% 99% 100% 100%  Weight:      Height:       General appearance: alert and no distress Lungs: clear to auscultation bilaterally Heart: mild tachycardia present. Normal rhythm, S1, S2 normal, no murmur, click,  rub or gallop Abdomen: soft, non-tender; bowel sounds normal; no masses,  no organomegaly. Back: no CVA tenderness.  Pelvic: deferred  Extremities: extremities normal, atraumatic, no cyanosis or edema  FHT: 165 bpm.   Labs: Results for orders placed or performed during the hospital encounter of 09/02/19  Respiratory Panel by RT PCR (Flu A&B, Covid) - Nasopharyngeal Swab   Specimen: Nasopharyngeal Swab  Result Value Ref Range   SARS Coronavirus 2 by RT PCR NEGATIVE NEGATIVE   Influenza A by PCR NEGATIVE NEGATIVE   Influenza B by PCR NEGATIVE NEGATIVE  Urine Culture   Specimen: Urine, Random  Result Value Ref Range   Specimen Description      URINE, RANDOM Performed at [facility], [address], [place], [place]    Special Requests NONE    Culture MULTIPLE SPECIES PRESENT, SUGGEST RECOLLECTION (A)    Report Status 09/04/2019 FINAL   Respiratory Panel by RT PCR (Flu A&B, Covid) - Nasopharyngeal Swab   Specimen: Nasopharyngeal Swab  Result Value Ref Range   SARS Coronavirus 2 by RT PCR NEGATIVE NEGATIVE   Influenza A by PCR  NEGATIVE NEGATIVE   Influenza B by PCR NEGATIVE NEGATIVE  Urinalysis, Complete w Microscopic  Result Value Ref Range   Color, Urine YELLOW (A) YELLOW   APPearance CLEAR (A) CLEAR   Specific Gravity, Urine 1.009 1.005 - 1.030   pH 7.0 5.0 - 8.0   Glucose, UA NEGATIVE NEGATIVE mg/dL   Hgb urine dipstick NEGATIVE NEGATIVE   Bilirubin Urine NEGATIVE NEGATIVE   Ketones, ur 20 (A) NEGATIVE mg/dL   Protein, ur 100 (A) NEGATIVE mg/dL   Nitrite NEGATIVE NEGATIVE   Leukocytes,Ua SMALL (A) NEGATIVE   RBC / HPF 0-5 0 - 5 RBC/hpf   WBC, UA 6-10 0 - 5 WBC/hpf   Bacteria, UA FEW (A) NONE SEEN   Squamous Epithelial / LPF 0-5 0 - 5   Mucus PRESENT   CBC  Result Value Ref Range   WBC 13.3 (H) 4.0 - 10.5 K/uL   RBC 3.45 (L) 3.87 - 5.11 MIL/uL   Hemoglobin 9.9 (L) 12.0 - 15.0 g/dL   HCT 29.9 (L) 36.0 - 46.0 %   MCV 86.7 80.0 - 100.0 fL   MCH  28.7 26.0 - 34.0 pg   MCHC 33.1 30.0 - 36.0 g/dL   RDW 12.7 11.5 - 15.5 %   Platelets 184 150 - 400 K/uL   nRBC 0.0 0.0 - 0.2 %  Comprehensive metabolic panel  Result Value Ref Range   Sodium 131 (L) 135 - 145 mmol/L   Potassium 3.2 (L) 3.5 - 5.1 mmol/L   Chloride 101 98 - 111 mmol/L   CO2 17 (L) 22 - 32 mmol/L   Glucose, Bld 82 70 - 99 mg/dL   BUN 5 (L) 6 - 20 mg/dL   Creatinine, Ser 0.81 0.44 - 1.00 mg/dL   Calcium 8.4 (L) 8.9 - 10.3 mg/dL   Total Protein 7.0 6.5 - 8.1 g/dL   Albumin 3.0 (L) 3.5 - 5.0 g/dL   AST 30 15 - 41 U/L   ALT 19 0 - 44 U/L   Alkaline Phosphatase 89 38 - 126 U/L   Total Bilirubin 0.8 0.3 - 1.2 mg/dL   GFR calc non Af Amer >60 >60 mL/min   GFR calc Af Amer >60 >60 mL/min   Anion gap 13 5 - 15  CBC with Differential/Platelet  Result Value Ref Range   WBC 14.1 (H) 4.0 - 10.5 K/uL   RBC 3.16 (L) 3.87 - 5.11 MIL/uL   Hemoglobin 9.2 (L) 12.0 - 15.0 g/dL   HCT 27.1 (L) 36.0 - 46.0 %   MCV 85.8 80.0 - 100.0 fL   MCH 29.1 26.0 - 34.0 pg   MCHC 33.9 30.0 - 36.0 g/dL   RDW 12.9 11.5 - 15.5 %   Platelets 178 150 - 400 K/uL   nRBC 0.0 0.0 - 0.2 %   Neutrophils Relative % 77 %   Neutro Abs 10.9 (H) 1.7 - 7.7 K/uL   Lymphocytes Relative 10 %   Lymphs Abs 1.4 0.7 - 4.0 K/uL   Monocytes Relative 11 %   Monocytes Absolute 1.6 (H) 0.1 - 1.0 K/uL   Eosinophils Relative 0 %   Eosinophils Absolute 0.0 0.0 - 0.5 K/uL   Basophils Relative 0 %   Basophils Absolute 0.0 0.0 - 0.1 K/uL   Immature Granulocytes 2 %   Abs Immature Granulocytes 0.22 (H) 0.00 - 0.07 K/uL  Basic metabolic panel  Result Value Ref Range   Sodium 130 (L) 135 - 145 mmol/L   Potassium 3.2 (L)  3.5 - 5.1 mmol/L   Chloride 104 98 - 111 mmol/L   CO2 19 (L) 22 - 32 mmol/L   Glucose, Bld 108 (H) 70 - 99 mg/dL   BUN <5 (L) 6 - 20 mg/dL   Creatinine, Ser 7.01 0.44 - 1.00 mg/dL   Calcium 8.1 (L) 8.9 - 10.3 mg/dL   GFR calc non Af Amer >60 >60 mL/min   GFR calc Af Amer >60 >60 mL/min   Anion gap 7  5 - 15  CBC with Differential/Platelet  Result Value Ref Range   WBC 9.0 4.0 - 10.5 K/uL   RBC 3.19 (L) 3.87 - 5.11 MIL/uL   Hemoglobin 9.2 (L) 12.0 - 15.0 g/dL   HCT 77.9 (L) 39.0 - 30.0 %   MCV 85.9 80.0 - 100.0 fL   MCH 28.8 26.0 - 34.0 pg   MCHC 33.6 30.0 - 36.0 g/dL   RDW 92.3 30.0 - 76.2 %   Platelets 187 150 - 400 K/uL   nRBC 0.0 0.0 - 0.2 %   Neutrophils Relative % 73 %   Neutro Abs 6.6 1.7 - 7.7 K/uL   Lymphocytes Relative 14 %   Lymphs Abs 1.3 0.7 - 4.0 K/uL   Monocytes Relative 11 %   Monocytes Absolute 1.0 0.1 - 1.0 K/uL   Eosinophils Relative 0 %   Eosinophils Absolute 0.0 0.0 - 0.5 K/uL   Basophils Relative 0 %   Basophils Absolute 0.0 0.0 - 0.1 K/uL   Immature Granulocytes 2 %   Abs Immature Granulocytes 0.18 (H) 0.00 - 0.07 K/uL  Type and screen [facility]  Result Value Ref Range   ABO/RH(D) O POS    Antibody Screen NEG    Sample Expiration      09/05/2019,2359 Performed at [facility], [address], [place], [place] [address]   ABO/Rh  Result Value Ref Range   ABO/RH(D)      O POS Performed at [facility], [address], [place], [place] [address]     Discharge instruction: per After Visit Summary and "Baby and Me Booklet".  After visit meds:  Allergies as of 09/04/2019   No Known Allergies     Medication List    TAKE these medications   acetaminophen 500 MG tablet Commonly known as: TYLENOL Take 1,000 mg by mouth every 6 (six) hours as needed for mild pain.   albuterol 108 (90 Base) MCG/ACT inhaler Commonly known as: VENTOLIN HFA Inhale 2 puffs into the lungs as needed.   cetirizine 10 MG tablet Commonly known as: ZYRTEC Take 10 mg by mouth as needed.   docusate sodium 100 MG capsule Commonly known as: COLACE Take 1 capsule (100 mg total) by mouth 2 (two) times daily as needed.   ferrous sulfate 325 (65 FE) MG tablet Commonly known as: FerrouSul Take 1 tablet (325 mg total) by mouth 2  (two) times daily.   multivitamin-prenatal 27-0.8 MG Tabs tablet Take 1 tablet by mouth daily at 12 noon.   nitrofurantoin (macrocrystal-monohydrate) 100 MG capsule Commonly known as: Macrobid Take 1 capsule (100 mg total) by mouth at bedtime. What changed: when to take this   ondansetron 4 MG tablet Commonly known as: Zofran Take 1 tablet (4 mg total) by mouth daily as needed for nausea or vomiting.       Diet: routine diet  Activity: Advance as tolerated. Pelvic rest for 6 weeks.   Outpatient follow up:1 week Follow up Appt: Future Appointments  Date Time Provider Department Center  09/11/2019  3:45 PM Gunnar Bulla, CNM [facility] None      09/04/2019 Hildred Laser, MD

## 2019-09-06 DIAGNOSIS — F6381 Intermittent explosive disorder: Secondary | ICD-10-CM | POA: Diagnosis not present

## 2019-09-11 ENCOUNTER — Ambulatory Visit (INDEPENDENT_AMBULATORY_CARE_PROVIDER_SITE_OTHER): Payer: Medicaid Other | Admitting: Certified Nurse Midwife

## 2019-09-11 ENCOUNTER — Other Ambulatory Visit: Payer: Self-pay

## 2019-09-11 VITALS — BP 119/72 | HR 77 | Wt 225.7 lb

## 2019-09-11 DIAGNOSIS — Z3A31 31 weeks gestation of pregnancy: Secondary | ICD-10-CM

## 2019-09-11 DIAGNOSIS — Z3493 Encounter for supervision of normal pregnancy, unspecified, third trimester: Secondary | ICD-10-CM

## 2019-09-11 DIAGNOSIS — Z3400 Encounter for supervision of normal first pregnancy, unspecified trimester: Secondary | ICD-10-CM | POA: Insufficient documentation

## 2019-09-11 DIAGNOSIS — Z3403 Encounter for supervision of normal first pregnancy, third trimester: Secondary | ICD-10-CM

## 2019-09-11 LAB — POCT URINALYSIS DIPSTICK OB
Bilirubin, UA: NEGATIVE
Blood, UA: NEGATIVE
Glucose, UA: NEGATIVE
Ketones, UA: NEGATIVE
Leukocytes, UA: NEGATIVE
Nitrite, UA: NEGATIVE
POC,PROTEIN,UA: NEGATIVE
Spec Grav, UA: 1.01 (ref 1.010–1.025)
Urobilinogen, UA: 0.2 E.U./dL
pH, UA: 7 (ref 5.0–8.0)

## 2019-09-11 NOTE — Progress Notes (Signed)
ROB-Doing well. Taking Macrobid daily. Third trimester handouts provided including [facility] Pamphlet and [facility] brochedure. Anticipatory guidance regarding course of prenatal care. Reviewed red flag symptoms and when to call. RTC x 2 weeks for ROB or sooner if needed.

## 2019-09-11 NOTE — Progress Notes (Signed)
[facility] student reviewed the Ready, Set, Baby Curriculum w/ patient.  Patient had a couple of questions in regards to BF that were answered by student.  Patient plan on breastfeeding.

## 2019-09-11 NOTE — Progress Notes (Signed)
ROB-Patient c/o 1 episode of clear watery vaginal discharge while in the shower 7 days ago, none since then.

## 2019-09-11 NOTE — Patient Instructions (Signed)
Fetal Movement Counts Patient Name: ________________________________________________ Patient Due Date: ____________________ What is a fetal movement count?  A fetal movement count is the number of times that you feel your baby move during a certain amount of time. This may also be called a fetal kick count. A fetal movement count is recommended for every pregnant woman. You may be asked to start counting fetal movements as early as week 28 of your pregnancy. Pay attention to when your baby is most active. You may notice your baby's sleep and wake cycles. You may also notice things that make your baby move more. You should do a fetal movement count:  When your baby is normally most active.  At the same time each day. A good time to count movements is while you are resting, after having something to eat and drink. How do I count fetal movements? 1. Find a quiet, comfortable area. Sit, or lie down on your side. 2. Write down the date, the start time and stop time, and the number of movements that you felt between those two times. Take this information with you to your health care visits. 3. Write down your start time when you feel the first movement. 4. Count kicks, flutters, swishes, rolls, and jabs. You should feel at least 10 movements. 5. You may stop counting after you have felt 10 movements, or if you have been counting for 2 hours. Write down the stop time. 6. If you do not feel 10 movements in 2 hours, contact your health care provider for further instructions. Your health care provider may want to do additional tests to assess your baby's well-being. Contact a health care provider if:  You feel fewer than 10 movements in 2 hours.  Your baby is not moving like he or she usually does. Date: ____________ Start time: ____________ Stop time: ____________ Movements: ____________ Date: ____________ Start time: ____________ Stop time: ____________ Movements: ____________ Date: ____________  Start time: ____________ Stop time: ____________ Movements: ____________ Date: ____________ Start time: ____________ Stop time: ____________ Movements: ____________ Date: ____________ Start time: ____________ Stop time: ____________ Movements: ____________ Date: ____________ Start time: ____________ Stop time: ____________ Movements: ____________ Date: ____________ Start time: ____________ Stop time: ____________ Movements: ____________ Date: ____________ Start time: ____________ Stop time: ____________ Movements: ____________ Date: ____________ Start time: ____________ Stop time: ____________ Movements: ____________ This information is not intended to replace advice given to you by your health care provider. Make sure you discuss any questions you have with your health care provider. Document Revised: 02/23/2019 Document Reviewed: 02/23/2019 Elsevier Patient Education  2020 Elsevier Inc.  

## 2019-09-13 DIAGNOSIS — F6381 Intermittent explosive disorder: Secondary | ICD-10-CM | POA: Diagnosis not present

## 2019-09-26 ENCOUNTER — Ambulatory Visit (INDEPENDENT_AMBULATORY_CARE_PROVIDER_SITE_OTHER): Payer: Medicaid Other | Admitting: Certified Nurse Midwife

## 2019-09-26 ENCOUNTER — Encounter: Payer: Self-pay | Admitting: Certified Nurse Midwife

## 2019-09-26 ENCOUNTER — Other Ambulatory Visit: Payer: Self-pay

## 2019-09-26 VITALS — BP 126/76 | HR 110 | Wt 227.2 lb

## 2019-09-26 DIAGNOSIS — N390 Urinary tract infection, site not specified: Secondary | ICD-10-CM

## 2019-09-26 DIAGNOSIS — O2303 Infections of kidney in pregnancy, third trimester: Secondary | ICD-10-CM | POA: Diagnosis not present

## 2019-09-26 DIAGNOSIS — Z3493 Encounter for supervision of normal pregnancy, unspecified, third trimester: Secondary | ICD-10-CM

## 2019-09-26 NOTE — Progress Notes (Signed)
ROB doing well. Feels good movement. Has some sinus congestion that she has been taking zyrtec for that has helped. RSB done today with Lactation . Discussed birth plan. She plan epidural. She state she will bring it in next visit. Reviewed GBS testing @ 36 wks. She verbalizes understanding. Follow up 2 wks with Marcelino Duster.   Doreene Burke, CNM

## 2019-09-26 NOTE — Patient Instructions (Signed)
[facility]  [facility]  [address], [place], [place] [address]  Phone: [phone]  [facility] (second location)  [address] [place], [place] [address]  Phone: [phone]  [facility] ([place]) [address], [place], [place] [address] Phone: [phone]  [facility]  [address] [place], [place] [address]  Phone: [phone] 

## 2019-09-27 DIAGNOSIS — F6381 Intermittent explosive disorder: Secondary | ICD-10-CM | POA: Diagnosis not present

## 2019-09-28 LAB — URINE CULTURE: Organism ID, Bacteria: NO GROWTH

## 2019-09-28 IMAGING — DX DG FOOT COMPLETE 3+V*L*
3 series · 3 of 3 positions shown · non-contrast
Comparison: None.

CLINICAL DATA: Lateral foot pain and swelling after hitting foot
last night

EXAM:
LEFT FOOT - COMPLETE 3+ VIEW

[foot ap]
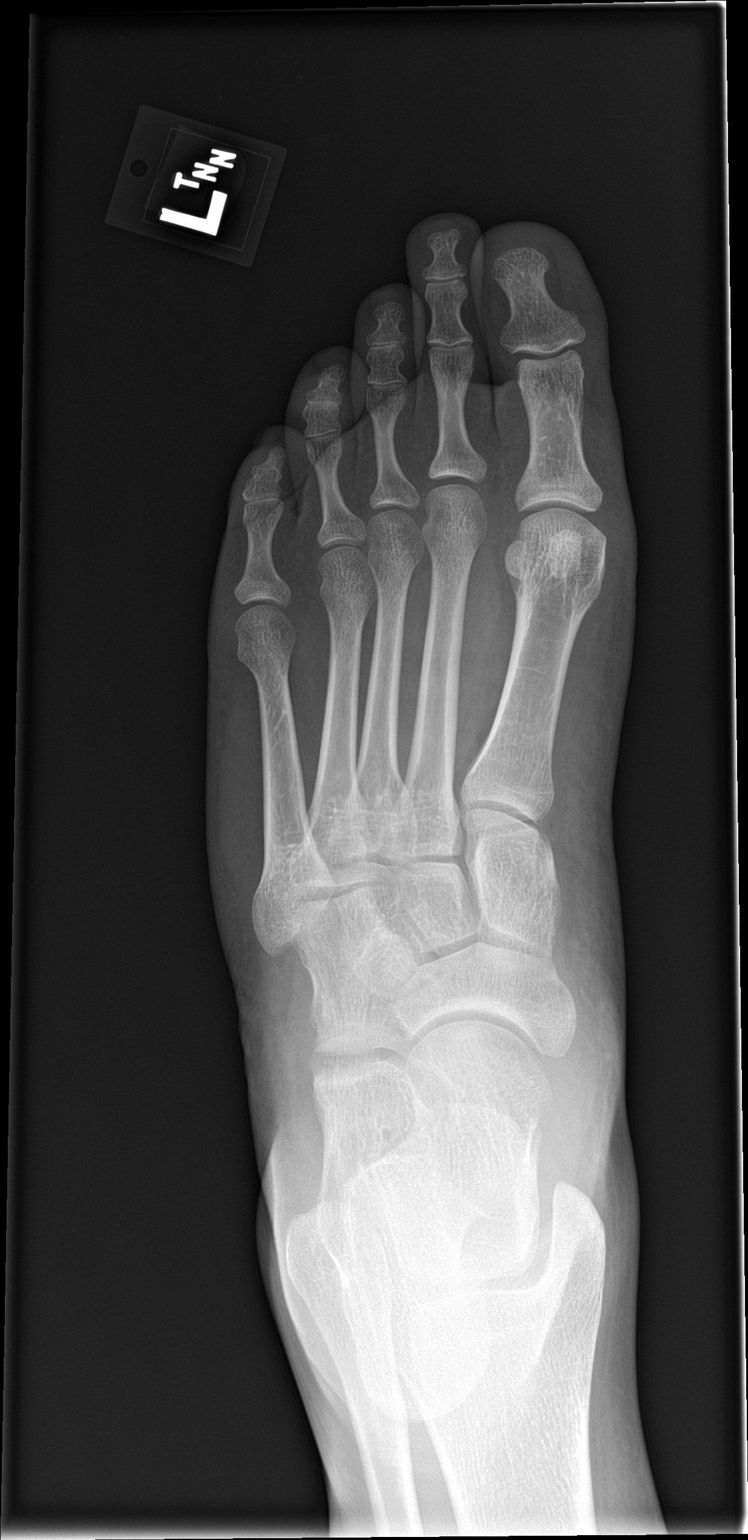

[foot obl]
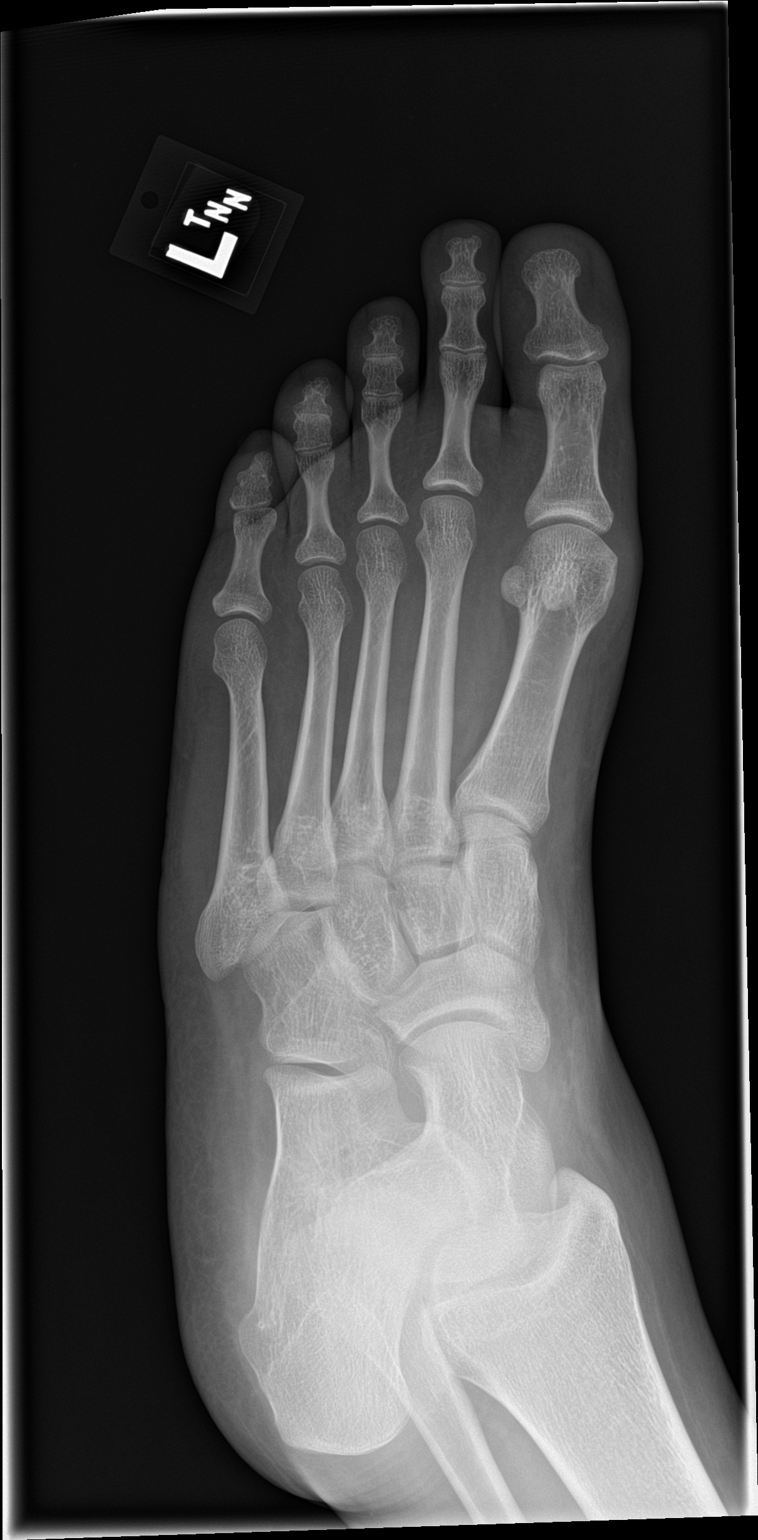

[foot lat]
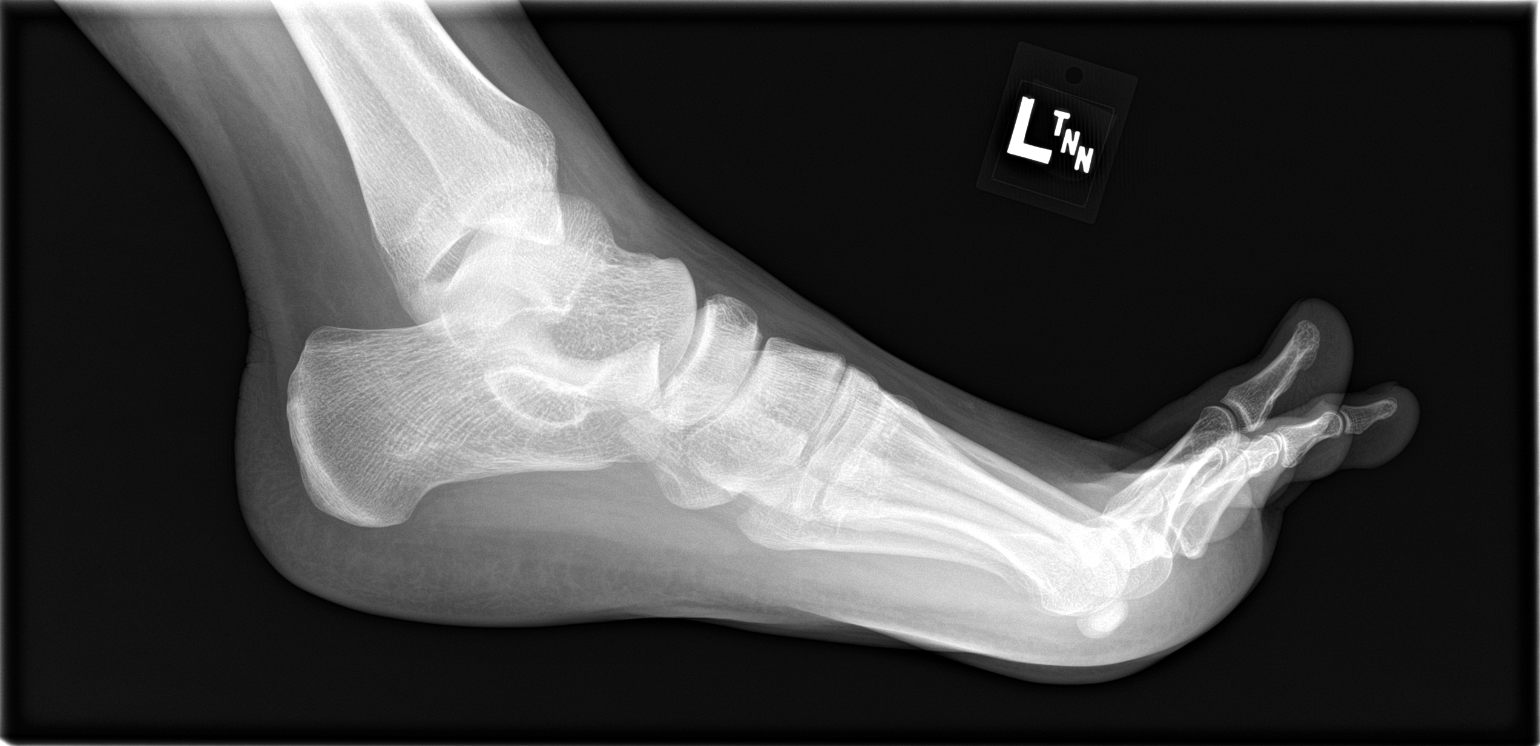

[3 of 3 positions shown; findings below may reference images not displayed]

FINDINGS: Tarsal-metatarsal alignment is normal. Joint spaces appear normal.
No fracture is seen.
IMPRESSION: Negative.

## 2019-10-11 DIAGNOSIS — F6381 Intermittent explosive disorder: Secondary | ICD-10-CM | POA: Diagnosis not present

## 2019-10-13 ENCOUNTER — Other Ambulatory Visit (HOSPITAL_COMMUNITY)
Admission: RE | Admit: 2019-10-13 | Discharge: 2019-10-13 | Disposition: A | Discharge status: Home / Self Care | Payer: Medicaid Other | Source: Ambulatory Visit | Attending: Certified Nurse Midwife | Admitting: Certified Nurse Midwife

## 2019-10-13 ENCOUNTER — Ambulatory Visit (INDEPENDENT_AMBULATORY_CARE_PROVIDER_SITE_OTHER): Payer: Medicaid Other | Admitting: Certified Nurse Midwife

## 2019-10-13 ENCOUNTER — Other Ambulatory Visit: Payer: Self-pay

## 2019-10-13 VITALS — BP 117/73 | HR 84 | Wt 226.4 lb

## 2019-10-13 DIAGNOSIS — O99013 Anemia complicating pregnancy, third trimester: Secondary | ICD-10-CM

## 2019-10-13 DIAGNOSIS — Z113 Encounter for screening for infections with a predominantly sexual mode of transmission: Secondary | ICD-10-CM | POA: Insufficient documentation

## 2019-10-13 DIAGNOSIS — Z3493 Encounter for supervision of normal pregnancy, unspecified, third trimester: Secondary | ICD-10-CM

## 2019-10-13 DIAGNOSIS — Z3A35 35 weeks gestation of pregnancy: Secondary | ICD-10-CM | POA: Diagnosis not present

## 2019-10-13 NOTE — Patient Instructions (Addendum)
Vaginal Delivery  Vaginal delivery means that you give birth by pushing your baby out of your birth canal (vagina). A team of health care providers will help you before, during, and after vaginal delivery. Birth experiences are unique for every woman and every pregnancy, and birth experiences vary depending on where you choose to give birth. What happens when I arrive at the birth center or hospital? Once you are in labor and have been admitted into the hospital or birth center, your health care provider may:  Review your pregnancy history and any concerns that you have.  Insert an IV into one of your veins. This may be used to give you fluids and medicines.  Check your blood pressure, pulse, temperature, and heart rate (vital signs).  Check whether your bag of water (amniotic sac) has broken (ruptured).  Talk with you about your birth plan and discuss pain control options. Monitoring Your health care provider may monitor your contractions (uterine monitoring) and your baby's heart rate (fetal monitoring). You may need to be monitored:  Often, but not continuously (intermittently).  All the time or for long periods at a time (continuously). Continuous monitoring may be needed if: ? You are taking certain medicines, such as medicine to relieve pain or make your contractions stronger. ? You have pregnancy or labor complications. Monitoring may be done by:  Placing a special stethoscope or a handheld monitoring device on your abdomen to check your baby's heartbeat and to check for contractions.  Placing monitors on your abdomen (external monitors) to record your baby's heartbeat and the frequency and length of contractions.  Placing monitors inside your uterus through your vagina (internal monitors) to record your baby's heartbeat and the frequency, length, and strength of your contractions. Depending on the type of monitor, it may remain in your uterus or on your baby's head until  birth.  Telemetry. This is a type of continuous monitoring that can be done with external or internal monitors. Instead of having to stay in bed, you are able to move around during telemetry. Physical exam Your health care provider may perform frequent physical exams. This may include:  Checking how and where your baby is positioned in your uterus.  Checking your cervix to determine: ? Whether it is thinning out (effacing). ? Whether it is opening up (dilating). What happens during labor and delivery?  Normal labor and delivery is divided into the following three stages: Stage 1  This is the longest stage of labor.  This stage can last for hours or days.  Throughout this stage, you will feel contractions. Contractions generally feel mild, infrequent, and irregular at first. They get stronger, more frequent (about every 2-3 minutes), and more regular as you move through this stage.  This stage ends when your cervix is completely dilated to 4 inches (10 cm) and completely effaced. Stage 2  This stage starts once your cervix is completely effaced and dilated and lasts until the delivery of your baby.  This stage may last from 20 minutes to 2 hours.  This is the stage where you will feel an urge to push your baby out of your vagina.  You may feel stretching and burning pain, especially when the widest part of your baby's head passes through the vaginal opening (crowning).  Once your baby is delivered, the umbilical cord will be clamped and cut. This usually occurs after waiting a period of 1-2 minutes after delivery.  Your baby will be placed on your bare chest (  skin-to-skin contact) in an upright position and covered with a warm blanket. Watch your baby for feeding cues, like rooting or sucking, and help the baby to your breast for his or her first feeding. Stage 3  This stage starts immediately after the birth of your baby and ends after you deliver the placenta.  This stage may  take anywhere from 5 to 30 minutes.  After your baby has been delivered, you will feel contractions as your body expels the placenta and your uterus contracts to control bleeding. What can I expect after labor and delivery?  After labor is over, you and your baby will be monitored closely until you are ready to go home to ensure that you are both healthy. Your health care team will teach you how to care for yourself and your baby.  You and your baby will stay in the same room (rooming in) during your hospital stay. This will encourage early bonding and successful breastfeeding.  You may continue to receive fluids and medicines through an IV.  Your uterus will be checked and massaged regularly (fundal massage).  You will have some soreness and pain in your abdomen, vagina, and the area of skin between your vaginal opening and your anus (perineum).  If an incision was made near your vagina (episiotomy) or if you had some vaginal tearing during delivery, cold compresses may be placed on your episiotomy or your tear. This helps to reduce pain and swelling.  You may be given a squirt bottle to use instead of wiping when you go to the bathroom. To use the squirt bottle, follow these steps: ? Before you urinate, fill the squirt bottle with warm water. Do not use hot water. ? After you urinate, while you are sitting on the toilet, use the squirt bottle to rinse the area around your urethra and vaginal opening. This rinses away any urine and blood. ? Fill the squirt bottle with clean water every time you use the bathroom.  It is normal to have vaginal bleeding after delivery. Wear a sanitary pad for vaginal bleeding and discharge. Summary  Vaginal delivery means that you will give birth by pushing your baby out of your birth canal (vagina).  Your health care provider may monitor your contractions (uterine monitoring) and your baby's heart rate (fetal monitoring).  Your health care provider may  perform a physical exam.  Normal labor and delivery is divided into three stages.  After labor is over, you and your baby will be monitored closely until you are ready to go home. This information is not intended to replace advice given to you by your health care provider. Make sure you discuss any questions you have with your health care provider. Document Revised: 08/10/2017 Document Reviewed: 08/10/2017 Elsevier Patient Education  2020 Elsevier Inc.    Fetal Movement Counts Patient Name: ________________________________________________ Patient Due Date: ____________________ What is a fetal movement count?  A fetal movement count is the number of times that you feel your baby move during a certain amount of time. This may also be called a fetal kick count. A fetal movement count is recommended for every pregnant woman. You may be asked to start counting fetal movements as early as week 28 of your pregnancy. Pay attention to when your baby is most active. You may notice your baby's sleep and wake cycles. You may also notice things that make your baby move more. You should do a fetal movement count:  When your baby is normally most   active.  At the same time each day. A good time to count movements is while you are resting, after having something to eat and drink. How do I count fetal movements? 1. Find a quiet, comfortable area. Sit, or lie down on your side. 2. Write down the date, the start time and stop time, and the number of movements that you felt between those two times. Take this information with you to your health care visits. 3. Write down your start time when you feel the first movement. 4. Count kicks, flutters, swishes, rolls, and jabs. You should feel at least 10 movements. 5. You may stop counting after you have felt 10 movements, or if you have been counting for 2 hours. Write down the stop time. 6. If you do not feel 10 movements in 2 hours, contact your health care  provider for further instructions. Your health care provider may want to do additional tests to assess your baby's well-being. Contact a health care provider if:  You feel fewer than 10 movements in 2 hours.  Your baby is not moving like he or she usually does. Date: ____________ Start time: ____________ Stop time: ____________ Movements: ____________ Date: ____________ Start time: ____________ Stop time: ____________ Movements: ____________ Date: ____________ Start time: ____________ Stop time: ____________ Movements: ____________ Date: ____________ Start time: ____________ Stop time: ____________ Movements: ____________ Date: ____________ Start time: ____________ Stop time: ____________ Movements: ____________ Date: ____________ Start time: ____________ Stop time: ____________ Movements: ____________ Date: ____________ Start time: ____________ Stop time: ____________ Movements: ____________ Date: ____________ Start time: ____________ Stop time: ____________ Movements: ____________ Date: ____________ Start time: ____________ Stop time: ____________ Movements: ____________ This information is not intended to replace advice given to you by your health care provider. Make sure you discuss any questions you have with your health care provider. Document Revised: 02/23/2019 Document Reviewed: 02/23/2019 Elsevier Patient Education  2020 Elsevier Inc.  

## 2019-10-13 NOTE — Progress Notes (Signed)
ROB-Doing well. Takes macrobid most days. Hasn't started iron supplements. Requests STI testing, see orders. 36 week cultures collected, see chart. Requests SVE, unable to reach cervix. Pre-labor checklist and herbal prep handout provided. Anticipatory guidance regarding course of prenatal care. Reviewed red flag symptoms and when to call. RTC x 1 week for ROB or sooner if needed.

## 2019-10-14 LAB — CBC
Hematocrit: 34 % (ref 34.0–46.6)
Hemoglobin: 11.3 g/dL (ref 11.1–15.9)
MCH: 29.3 pg (ref 26.6–33.0)
MCHC: 33.2 g/dL (ref 31.5–35.7)
MCV: 88 fL (ref 79–97)
Platelets: 240 10*3/uL (ref 150–450)
RBC: 3.86 x10E6/uL (ref 3.77–5.28)
RDW: 13.6 % (ref 11.7–15.4)
WBC: 7 10*3/uL (ref 3.4–10.8)

## 2019-10-14 LAB — HIV ANTIBODY (ROUTINE TESTING W REFLEX): HIV Screen 4th Generation wRfx: NONREACTIVE

## 2019-10-14 LAB — RPR: RPR Ser Ql: NONREACTIVE

## 2019-10-15 LAB — STREP GP B NAA: Strep Gp B NAA: NEGATIVE

## 2019-10-17 LAB — CERVICOVAGINAL ANCILLARY ONLY
Bacterial Vaginitis (gardnerella): POSITIVE — AB
Candida Glabrata: NEGATIVE
Candida Vaginitis: NEGATIVE
Chlamydia: NEGATIVE
Comment: NEGATIVE
Comment: NEGATIVE
Comment: NEGATIVE
Comment: NEGATIVE
Comment: NEGATIVE
Comment: NORMAL
Neisseria Gonorrhea: NEGATIVE
Trichomonas: NEGATIVE

## 2019-10-18 ENCOUNTER — Encounter: Payer: Self-pay | Admitting: Certified Nurse Midwife

## 2019-10-18 ENCOUNTER — Other Ambulatory Visit: Payer: Self-pay

## 2019-10-18 ENCOUNTER — Ambulatory Visit (INDEPENDENT_AMBULATORY_CARE_PROVIDER_SITE_OTHER): Payer: Medicaid Other | Admitting: Certified Nurse Midwife

## 2019-10-18 VITALS — BP 122/72 | HR 91 | Wt 229.4 lb

## 2019-10-18 DIAGNOSIS — F6381 Intermittent explosive disorder: Secondary | ICD-10-CM | POA: Diagnosis not present

## 2019-10-18 DIAGNOSIS — Z3493 Encounter for supervision of normal pregnancy, unspecified, third trimester: Secondary | ICD-10-CM

## 2019-10-18 LAB — POCT URINALYSIS DIPSTICK OB
Bilirubin, UA: NEGATIVE
Blood, UA: NEGATIVE
Glucose, UA: NEGATIVE
Ketones, UA: NEGATIVE
Leukocytes, UA: NEGATIVE
Nitrite, UA: NEGATIVE
POC,PROTEIN,UA: NEGATIVE
Spec Grav, UA: 1.01 (ref 1.010–1.025)
Urobilinogen, UA: 0.2 E.U./dL
pH, UA: 5 (ref 5.0–8.0)

## 2019-10-18 MED ORDER — METRONIDAZOLE 500 MG PO TABS: 500.0000 mg | ORAL_TABLET | Freq: Two times a day (BID) | ORAL | 0 refills | Status: DC

## 2019-10-18 NOTE — Progress Notes (Signed)
ROB doing well. Feels good movement. Discussed RSB initiative, see pregnancy check list for completed topics. Reviewed labor precautions. Pt encouraged to use herbal prep handout to improve cervical ripeness for labor. She verbalizes and agrees . Follow up 1 wk with Marcelino Duster.   Doreene Burke, CNM

## 2019-10-18 NOTE — Patient Instructions (Signed)
Braxton Hicks Contractions °Contractions of the uterus can occur throughout pregnancy, but they are not always a sign that you are in labor. You may have practice contractions called Braxton Hicks contractions. These false labor contractions are sometimes confused with true labor. °What are Braxton Hicks contractions? °Braxton Hicks contractions are tightening movements that occur in the muscles of the uterus before labor. Unlike true labor contractions, these contractions do not result in opening (dilation) and thinning of the cervix. Toward the end of pregnancy (32-34 weeks), Braxton Hicks contractions can happen more often and may become stronger. These contractions are sometimes difficult to tell apart from true labor because they can be very uncomfortable. You should not feel embarrassed if you go to the hospital with false labor. °Sometimes, the only way to tell if you are in true labor is for your health care provider to look for changes in the cervix. The health care provider will do a physical exam and may monitor your contractions. If you are not in true labor, the exam should show that your cervix is not dilating and your water has not broken. °If there are no other health problems associated with your pregnancy, it is completely safe for you to be sent home with false labor. You may continue to have Braxton Hicks contractions until you go into true labor. °How to tell the difference between true labor and false labor °True labor °· Contractions last 30-70 seconds. °· Contractions become very regular. °· Discomfort is usually felt in the top of the uterus, and it spreads to the lower abdomen and low back. °· Contractions do not go away with walking. °· Contractions usually become more intense and increase in frequency. °· The cervix dilates and gets thinner. °False labor °· Contractions are usually shorter and not as strong as true labor contractions. °· Contractions are usually irregular. °· Contractions  are often felt in the front of the lower abdomen and in the groin. °· Contractions may go away when you walk around or change positions while lying down. °· Contractions get weaker and are shorter-lasting as time goes on. °· The cervix usually does not dilate or become thin. °Follow these instructions at home: ° °· Take over-the-counter and prescription medicines only as told by your health care provider. °· Keep up with your usual exercises and follow other instructions from your health care provider. °· Eat and drink lightly if you think you are going into labor. °· If Braxton Hicks contractions are making you uncomfortable: °? Change your position from lying down or resting to walking, or change from walking to resting. °? Sit and rest in a tub of warm water. °? Drink enough fluid to keep your urine pale yellow. Dehydration may cause these contractions. °? Do slow and deep breathing several times an hour. °· Keep all follow-up prenatal visits as told by your health care provider. This is important. °Contact a health care provider if: °· You have a fever. °· You have continuous pain in your abdomen. °Get help right away if: °· Your contractions become stronger, more regular, and closer together. °· You have fluid leaking or gushing from your vagina. °· You pass blood-tinged mucus (bloody show). °· You have bleeding from your vagina. °· You have low back pain that you never had before. °· You feel your baby’s head pushing down and causing pelvic pressure. °· Your baby is not moving inside you as much as it used to. °Summary °· Contractions that occur before labor are   called Braxton Hicks contractions, false labor, or practice contractions. °· Braxton Hicks contractions are usually shorter, weaker, farther apart, and less regular than true labor contractions. True labor contractions usually become progressively stronger and regular, and they become more frequent. °· Manage discomfort from Braxton Hicks contractions  by changing position, resting in a warm bath, drinking plenty of water, or practicing deep breathing. °This information is not intended to replace advice given to you by your health care provider. Make sure you discuss any questions you have with your health care provider. °Document Revised: 06/18/2017 Document Reviewed: 11/19/2016 °Elsevier Patient Education © 2020 [employer] ° °

## 2019-10-18 NOTE — Telephone Encounter (Signed)
Please send Flagyl Rx for BV treatment. Thanks, JML

## 2019-10-25 DIAGNOSIS — F6381 Intermittent explosive disorder: Secondary | ICD-10-CM | POA: Diagnosis not present

## 2019-10-27 ENCOUNTER — Ambulatory Visit (INDEPENDENT_AMBULATORY_CARE_PROVIDER_SITE_OTHER): Payer: Medicaid Other | Admitting: Certified Nurse Midwife

## 2019-10-27 ENCOUNTER — Other Ambulatory Visit: Payer: Self-pay

## 2019-10-27 VITALS — BP 120/76 | HR 97 | Wt 229.2 lb

## 2019-10-27 DIAGNOSIS — Z3493 Encounter for supervision of normal pregnancy, unspecified, third trimester: Secondary | ICD-10-CM | POA: Diagnosis not present

## 2019-10-27 DIAGNOSIS — Z3A38 38 weeks gestation of pregnancy: Secondary | ICD-10-CM

## 2019-10-27 LAB — POCT URINALYSIS DIPSTICK OB
Bilirubin, UA: NEGATIVE
Blood, UA: NEGATIVE
Glucose, UA: NEGATIVE
Ketones, UA: NEGATIVE
Leukocytes, UA: NEGATIVE
Nitrite, UA: NEGATIVE
POC,PROTEIN,UA: NEGATIVE
Spec Grav, UA: 1.01 (ref 1.010–1.025)
Urobilinogen, UA: 0.2 E.U./dL
pH, UA: 5 (ref 5.0–8.0)

## 2019-10-27 NOTE — Patient Instructions (Signed)
Fetal Movement Counts Patient Name: ________________________________________________ Patient Due Date: ____________________ What is a fetal movement count?  A fetal movement count is the number of times that you feel your baby move during a certain amount of time. This may also be called a fetal kick count. A fetal movement count is recommended for every pregnant woman. You may be asked to start counting fetal movements as early as week 28 of your pregnancy. Pay attention to when your baby is most active. You may notice your baby's sleep and wake cycles. You may also notice things that make your baby move more. You should do a fetal movement count:  When your baby is normally most active.  At the same time each day. A good time to count movements is while you are resting, after having something to eat and drink. How do I count fetal movements? 1. Find a quiet, comfortable area. Sit, or lie down on your side. 2. Write down the date, the start time and stop time, and the number of movements that you felt between those two times. Take this information with you to your health care visits. 3. Write down your start time when you feel the first movement. 4. Count kicks, flutters, swishes, rolls, and jabs. You should feel at least 10 movements. 5. You may stop counting after you have felt 10 movements, or if you have been counting for 2 hours. Write down the stop time. 6. If you do not feel 10 movements in 2 hours, contact your health care provider for further instructions. Your health care provider may want to do additional tests to assess your baby's well-being. Contact a health care provider if:  You feel fewer than 10 movements in 2 hours.  Your baby is not moving like he or she usually does. Date: ____________ Start time: ____________ Stop time: ____________ Movements: ____________ Date: ____________ Start time: ____________ Stop time: ____________ Movements: ____________ Date: ____________  Start time: ____________ Stop time: ____________ Movements: ____________ Date: ____________ Start time: ____________ Stop time: ____________ Movements: ____________ Date: ____________ Start time: ____________ Stop time: ____________ Movements: ____________ Date: ____________ Start time: ____________ Stop time: ____________ Movements: ____________ Date: ____________ Start time: ____________ Stop time: ____________ Movements: ____________ Date: ____________ Start time: ____________ Stop time: ____________ Movements: ____________ Date: ____________ Start time: ____________ Stop time: ____________ Movements: ____________ This information is not intended to replace advice given to you by your health care provider. Make sure you discuss any questions you have with your health care provider. Document Revised: 02/23/2019 Document Reviewed: 02/23/2019 Elsevier Patient Education  2020 [employer]   Vaginal Delivery  Vaginal delivery means that you give birth by pushing your baby out of your birth canal (vagina). A team of health care providers will help you before, during, and after vaginal delivery. Birth experiences are unique for every woman and every pregnancy, and birth experiences vary depending on where you choose to give birth. What happens when I arrive at the birth center or hospital? Once you are in labor and have been admitted into the hospital or birth center, your health care provider may:  Review your pregnancy history and any concerns that you have.  Insert an IV into one of your veins. This may be used to give you fluids and medicines.  Check your blood pressure, pulse, temperature, and heart rate (vital signs).  Check whether your bag of water (amniotic sac) has broken (ruptured).  Talk with you about your birth plan and discuss pain control options. Monitoring   Your health care provider may monitor your contractions (uterine monitoring) and your baby's heart rate (fetal  monitoring). You may need to be monitored:  Often, but not continuously (intermittently).  All the time or for long periods at a time (continuously). Continuous monitoring may be needed if: ? You are taking certain medicines, such as medicine to relieve pain or make your contractions stronger. ? You have pregnancy or labor complications. Monitoring may be done by:  Placing a special stethoscope or a handheld monitoring device on your abdomen to check your baby's heartbeat and to check for contractions.  Placing monitors on your abdomen (external monitors) to record your baby's heartbeat and the frequency and length of contractions.  Placing monitors inside your uterus through your vagina (internal monitors) to record your baby's heartbeat and the frequency, length, and strength of your contractions. Depending on the type of monitor, it may remain in your uterus or on your baby's head until birth.  Telemetry. This is a type of continuous monitoring that can be done with external or internal monitors. Instead of having to stay in bed, you are able to move around during telemetry. Physical exam Your health care provider may perform frequent physical exams. This may include:  Checking how and where your baby is positioned in your uterus.  Checking your cervix to determine: ? Whether it is thinning out (effacing). ? Whether it is opening up (dilating). What happens during labor and delivery?  Normal labor and delivery is divided into the following three stages: Stage 1  This is the longest stage of labor.  This stage can last for hours or days.  Throughout this stage, you will feel contractions. Contractions generally feel mild, infrequent, and irregular at first. They get stronger, more frequent (about every 2-3 minutes), and more regular as you move through this stage.  This stage ends when your cervix is completely dilated to 4 inches (10 cm) and completely effaced. Stage 2  This  stage starts once your cervix is completely effaced and dilated and lasts until the delivery of your baby.  This stage may last from 20 minutes to 2 hours.  This is the stage where you will feel an urge to push your baby out of your vagina.  You may feel stretching and burning pain, especially when the widest part of your baby's head passes through the vaginal opening (crowning).  Once your baby is delivered, the umbilical cord will be clamped and cut. This usually occurs after waiting a period of 1-2 minutes after delivery.  Your baby will be placed on your bare chest (skin-to-skin contact) in an upright position and covered with a warm blanket. Watch your baby for feeding cues, like rooting or sucking, and help the baby to your breast for his or her first feeding. Stage 3  This stage starts immediately after the birth of your baby and ends after you deliver the placenta.  This stage may take anywhere from 5 to 30 minutes.  After your baby has been delivered, you will feel contractions as your body expels the placenta and your uterus contracts to control bleeding. What can I expect after labor and delivery?  After labor is over, you and your baby will be monitored closely until you are ready to go home to ensure that you are both healthy. Your health care team will teach you how to care for yourself and your baby.  You and your baby will stay in the same room (rooming in) during your   hospital stay. This will encourage early bonding and successful breastfeeding.  You may continue to receive fluids and medicines through an IV.  Your uterus will be checked and massaged regularly (fundal massage).  You will have some soreness and pain in your abdomen, vagina, and the area of skin between your vaginal opening and your anus (perineum).  If an incision was made near your vagina (episiotomy) or if you had some vaginal tearing during delivery, cold compresses may be placed on your episiotomy or  your tear. This helps to reduce pain and swelling.  You may be given a squirt bottle to use instead of wiping when you go to the bathroom. To use the squirt bottle, follow these steps: ? Before you urinate, fill the squirt bottle with warm water. Do not use hot water. ? After you urinate, while you are sitting on the toilet, use the squirt bottle to rinse the area around your urethra and vaginal opening. This rinses away any urine and blood. ? Fill the squirt bottle with clean water every time you use the bathroom.  It is normal to have vaginal bleeding after delivery. Wear a sanitary pad for vaginal bleeding and discharge. Summary  Vaginal delivery means that you will give birth by pushing your baby out of your birth canal (vagina).  Your health care provider may monitor your contractions (uterine monitoring) and your baby's heart rate (fetal monitoring).  Your health care provider may perform a physical exam.  Normal labor and delivery is divided into three stages.  After labor is over, you and your baby will be monitored closely until you are ready to go home. This information is not intended to replace advice given to you by your health care provider. Make sure you discuss any questions you have with your health care provider. Document Revised: 08/10/2017 Document Reviewed: 08/10/2017 Elsevier Patient Education  2020 [employer]  

## 2019-10-29 LAB — URINE CULTURE: Organism ID, Bacteria: NO GROWTH

## 2019-10-30 NOTE — Progress Notes (Signed)
ROB-Doing well, packed and ready to go. Monthly urine culture sent, see orders. Anticipatory guidance regarding course of prenatal care. Reviewed red flag symptoms and when to call. RTC x 1 week for ROB or sooner if needed.

## 2019-11-01 ENCOUNTER — Other Ambulatory Visit: Payer: Self-pay

## 2019-11-01 ENCOUNTER — Encounter: Payer: Self-pay | Admitting: Certified Nurse Midwife

## 2019-11-01 ENCOUNTER — Ambulatory Visit (INDEPENDENT_AMBULATORY_CARE_PROVIDER_SITE_OTHER): Payer: Medicaid Other | Admitting: Certified Nurse Midwife

## 2019-11-01 VITALS — BP 116/77 | HR 92 | Wt 232.5 lb

## 2019-11-01 DIAGNOSIS — Z3A38 38 weeks gestation of pregnancy: Secondary | ICD-10-CM

## 2019-11-01 NOTE — Patient Instructions (Signed)
Braxton Hicks Contractions °Contractions of the uterus can occur throughout pregnancy, but they are not always a sign that you are in labor. You may have practice contractions called Braxton Hicks contractions. These false labor contractions are sometimes confused with true labor. °What are Braxton Hicks contractions? °Braxton Hicks contractions are tightening movements that occur in the muscles of the uterus before labor. Unlike true labor contractions, these contractions do not result in opening (dilation) and thinning of the cervix. Toward the end of pregnancy (32-34 weeks), Braxton Hicks contractions can happen more often and may become stronger. These contractions are sometimes difficult to tell apart from true labor because they can be very uncomfortable. You should not feel embarrassed if you go to the hospital with false labor. °Sometimes, the only way to tell if you are in true labor is for your health care provider to look for changes in the cervix. The health care provider will do a physical exam and may monitor your contractions. If you are not in true labor, the exam should show that your cervix is not dilating and your water has not broken. °If there are no other health problems associated with your pregnancy, it is completely safe for you to be sent home with false labor. You may continue to have Braxton Hicks contractions until you go into true labor. °How to tell the difference between true labor and false labor °True labor °· Contractions last 30-70 seconds. °· Contractions become very regular. °· Discomfort is usually felt in the top of the uterus, and it spreads to the lower abdomen and low back. °· Contractions do not go away with walking. °· Contractions usually become more intense and increase in frequency. °· The cervix dilates and gets thinner. °False labor °· Contractions are usually shorter and not as strong as true labor contractions. °· Contractions are usually irregular. °· Contractions  are often felt in the front of the lower abdomen and in the groin. °· Contractions may go away when you walk around or change positions while lying down. °· Contractions get weaker and are shorter-lasting as time goes on. °· The cervix usually does not dilate or become thin. °Follow these instructions at home: ° °· Take over-the-counter and prescription medicines only as told by your health care provider. °· Keep up with your usual exercises and follow other instructions from your health care provider. °· Eat and drink lightly if you think you are going into labor. °· If Braxton Hicks contractions are making you uncomfortable: °? Change your position from lying down or resting to walking, or change from walking to resting. °? Sit and rest in a tub of warm water. °? Drink enough fluid to keep your urine pale yellow. Dehydration may cause these contractions. °? Do slow and deep breathing several times an hour. °· Keep all follow-up prenatal visits as told by your health care provider. This is important. °Contact a health care provider if: °· You have a fever. °· You have continuous pain in your abdomen. °Get help right away if: °· Your contractions become stronger, more regular, and closer together. °· You have fluid leaking or gushing from your vagina. °· You pass blood-tinged mucus (bloody show). °· You have bleeding from your vagina. °· You have low back pain that you never had before. °· You feel your baby’s head pushing down and causing pelvic pressure. °· Your baby is not moving inside you as much as it used to. °Summary °· Contractions that occur before labor are   called Braxton Hicks contractions, false labor, or practice contractions. °· Braxton Hicks contractions are usually shorter, weaker, farther apart, and less regular than true labor contractions. True labor contractions usually become progressively stronger and regular, and they become more frequent. °· Manage discomfort from Braxton Hicks contractions  by changing position, resting in a warm bath, drinking plenty of water, or practicing deep breathing. °This information is not intended to replace advice given to you by your health care provider. Make sure you discuss any questions you have with your health care provider. °Document Revised: 06/18/2017 Document Reviewed: 11/19/2016 °Elsevier Patient Education © 2020 [employer] ° °

## 2019-11-01 NOTE — Progress Notes (Signed)
ROB doing well. Feels good fetal movement. Labor precaustions reviewed. Follow up 1 wk with Marcelino Duster.   Doreene Burke, CNM

## 2019-11-02 DIAGNOSIS — F6381 Intermittent explosive disorder: Secondary | ICD-10-CM | POA: Diagnosis not present

## 2019-11-02 LAB — POCT URINALYSIS DIPSTICK OB
Bilirubin, UA: NEGATIVE
Bilirubin, UA: NEGATIVE
Blood, UA: NEGATIVE
Blood, UA: NEGATIVE
Glucose, UA: NEGATIVE
Glucose, UA: NEGATIVE
Ketones, UA: NEGATIVE
Ketones, UA: NEGATIVE
Leukocytes, UA: NEGATIVE
Leukocytes, UA: NEGATIVE
Nitrite, UA: NEGATIVE
Nitrite, UA: NEGATIVE
POC,PROTEIN,UA: NEGATIVE
POC,PROTEIN,UA: NEGATIVE
Spec Grav, UA: 1.01 (ref 1.010–1.025)
Spec Grav, UA: 1.025 (ref 1.010–1.025)
Urobilinogen, UA: 0.2 E.U./dL
Urobilinogen, UA: 0.2 E.U./dL
pH, UA: 5 (ref 5.0–8.0)
pH, UA: 5 (ref 5.0–8.0)

## 2019-11-07 ENCOUNTER — Encounter: Payer: Self-pay | Admitting: Obstetrics and Gynecology

## 2019-11-07 ENCOUNTER — Inpatient Hospital Stay: Payer: Medicaid Other | Admitting: Anesthesiology

## 2019-11-07 ENCOUNTER — Other Ambulatory Visit: Payer: Self-pay

## 2019-11-07 ENCOUNTER — Inpatient Hospital Stay
Admission: EM | Admit: 2019-11-07 | Discharge: 2019-11-09 | DRG: 807 | Disposition: A | Discharge status: Home / Self Care | Payer: Medicaid Other | Attending: Certified Nurse Midwife | Admitting: Certified Nurse Midwife

## 2019-11-07 DIAGNOSIS — O283 Abnormal ultrasonic finding on antenatal screening of mother: Secondary | ICD-10-CM | POA: Diagnosis present

## 2019-11-07 DIAGNOSIS — J45909 Unspecified asthma, uncomplicated: Secondary | ICD-10-CM | POA: Diagnosis present

## 2019-11-07 DIAGNOSIS — Z3A39 39 weeks gestation of pregnancy: Secondary | ICD-10-CM | POA: Diagnosis not present

## 2019-11-07 DIAGNOSIS — Z20822 Contact with and (suspected) exposure to covid-19: Secondary | ICD-10-CM | POA: Diagnosis not present

## 2019-11-07 DIAGNOSIS — O26833 Pregnancy related renal disease, third trimester: Secondary | ICD-10-CM | POA: Diagnosis not present

## 2019-11-07 DIAGNOSIS — F418 Other specified anxiety disorders: Secondary | ICD-10-CM | POA: Diagnosis not present

## 2019-11-07 DIAGNOSIS — O9952 Diseases of the respiratory system complicating childbirth: Secondary | ICD-10-CM | POA: Diagnosis not present

## 2019-11-07 DIAGNOSIS — O99214 Obesity complicating childbirth: Secondary | ICD-10-CM | POA: Diagnosis not present

## 2019-11-07 DIAGNOSIS — O4292 Full-term premature rupture of membranes, unspecified as to length of time between rupture and onset of labor: Secondary | ICD-10-CM | POA: Diagnosis not present

## 2019-11-07 DIAGNOSIS — D649 Anemia, unspecified: Secondary | ICD-10-CM | POA: Diagnosis not present

## 2019-11-07 DIAGNOSIS — E669 Obesity, unspecified: Secondary | ICD-10-CM | POA: Diagnosis not present

## 2019-11-07 DIAGNOSIS — O99013 Anemia complicating pregnancy, third trimester: Secondary | ICD-10-CM

## 2019-11-07 DIAGNOSIS — O4212 Full-term premature rupture of membranes, onset of labor more than 24 hours following rupture: Secondary | ICD-10-CM | POA: Diagnosis not present

## 2019-11-07 DIAGNOSIS — O9902 Anemia complicating childbirth: Secondary | ICD-10-CM | POA: Diagnosis present

## 2019-11-07 DIAGNOSIS — D563 Thalassemia minor: Secondary | ICD-10-CM | POA: Diagnosis present

## 2019-11-07 DIAGNOSIS — Z3403 Encounter for supervision of normal first pregnancy, third trimester: Principal | ICD-10-CM

## 2019-11-07 LAB — CBC
HCT: 31.2 % — ABNORMAL LOW (ref 36.0–46.0)
Hemoglobin: 11 g/dL — ABNORMAL LOW (ref 12.0–15.0)
MCH: 29.1 pg (ref 26.0–34.0)
MCHC: 35.3 g/dL (ref 30.0–36.0)
MCV: 82.5 fL (ref 80.0–100.0)
Platelets: 244 10*3/uL (ref 150–400)
RBC: 3.78 MIL/uL — ABNORMAL LOW (ref 3.87–5.11)
RDW: 13.2 % (ref 11.5–15.5)
WBC: 7.8 10*3/uL (ref 4.0–10.5)
nRBC: 0 % (ref 0.0–0.2)

## 2019-11-07 LAB — URINALYSIS, COMPLETE (UACMP) WITH MICROSCOPIC
Bacteria, UA: NONE SEEN
Bilirubin Urine: NEGATIVE
Glucose, UA: NEGATIVE mg/dL
Hgb urine dipstick: NEGATIVE
Ketones, ur: NEGATIVE mg/dL
Leukocytes,Ua: NEGATIVE
Nitrite: NEGATIVE
Protein, ur: NEGATIVE mg/dL
Specific Gravity, Urine: 1.003 — ABNORMAL LOW (ref 1.005–1.030)
pH: 7 (ref 5.0–8.0)

## 2019-11-07 LAB — COMPREHENSIVE METABOLIC PANEL
ALT: 12 U/L (ref 0–44)
AST: 16 U/L (ref 15–41)
Albumin: 2.8 g/dL — ABNORMAL LOW (ref 3.5–5.0)
Alkaline Phosphatase: 144 U/L — ABNORMAL HIGH (ref 38–126)
Anion gap: 7 (ref 5–15)
BUN: 6 mg/dL (ref 6–20)
CO2: 19 mmol/L — ABNORMAL LOW (ref 22–32)
Calcium: 8.8 mg/dL — ABNORMAL LOW (ref 8.9–10.3)
Chloride: 113 mmol/L — ABNORMAL HIGH (ref 98–111)
Creatinine, Ser: 0.85 mg/dL (ref 0.44–1.00)
GFR calc Af Amer: >60 mL/min (ref >60)
GFR calc non Af Amer: >60 mL/min (ref >60)
Glucose, Bld: 88 mg/dL (ref 70–99)
Potassium: 3.5 mmol/L (ref 3.5–5.1)
Sodium: 139 mmol/L (ref 135–145)
Total Bilirubin: 0.6 mg/dL (ref 0.3–1.2)
Total Protein: 6.2 g/dL — ABNORMAL LOW (ref 6.5–8.1)

## 2019-11-07 LAB — RESPIRATORY PANEL BY RT PCR (FLU A&B, COVID)
Influenza A by PCR: NEGATIVE
Influenza B by PCR: NEGATIVE
SARS Coronavirus 2 by RT PCR: NEGATIVE

## 2019-11-07 LAB — TYPE AND SCREEN
ABO/RH(D): O POS
Antibody Screen: NEGATIVE

## 2019-11-07 LAB — RUPTURE OF MEMBRANE (ROM)PLUS: Rom Plus: POSITIVE

## 2019-11-07 MED ORDER — OXYTOCIN BOLUS FROM INFUSION
500.0000 mL | Freq: Once | INTRAVENOUS | Status: AC
  Administered 2019-11-08: 03:00:00 500 mL via INTRAVENOUS

## 2019-11-07 MED ORDER — ZOLPIDEM TARTRATE 5 MG PO TABS: 5.0000 mg | ORAL_TABLET | Freq: Every evening | ORAL | Status: DC | PRN

## 2019-11-07 MED ORDER — MISOPROSTOL 100 MCG PO TABS
50.0000 ug | ORAL_TABLET | ORAL | Status: DC
  Filled 2019-11-07 (×2): qty 1

## 2019-11-07 MED ORDER — EPHEDRINE 5 MG/ML INJ
10.0000 mg | INTRAVENOUS | Status: DC | PRN
  Filled 2019-11-07: qty 2

## 2019-11-07 MED ORDER — LACTATED RINGERS IV SOLN: 500.0000 mL | Freq: Once | INTRAVENOUS | Status: DC

## 2019-11-07 MED ORDER — PENICILLIN G 3 MILLION UNITS IVPB - SIMPLE MED
3.0000 10*6.[IU] | INTRAVENOUS | Status: DC
  Administered 2019-11-08: 3 10*6.[IU] via INTRAVENOUS
  Filled 2019-11-07 (×4): qty 100

## 2019-11-07 MED ORDER — PHENYLEPHRINE 40 MCG/ML (10ML) SYRINGE FOR IV PUSH (FOR BLOOD PRESSURE SUPPORT)
80.0000 ug | PREFILLED_SYRINGE | INTRAVENOUS | Status: DC | PRN
  Filled 2019-11-07: qty 10

## 2019-11-07 MED ORDER — SODIUM CHLORIDE 0.9% FLUSH: 3.0000 mL | Freq: Two times a day (BID) | INTRAVENOUS | Status: DC

## 2019-11-07 MED ORDER — BUTORPHANOL TARTRATE 1 MG/ML IJ SOLN
1.0000 mg | INTRAMUSCULAR | Status: DC | PRN
  Administered 2019-11-07: 1 mg via INTRAVENOUS
  Filled 2019-11-07: qty 1

## 2019-11-07 MED ORDER — LACTATED RINGERS IV SOLN: INTRAVENOUS | Status: DC

## 2019-11-07 MED ORDER — OXYTOCIN 40 UNITS IN NORMAL SALINE INFUSION - SIMPLE MED
2.5000 [IU]/h | INTRAVENOUS | Status: DC
  Filled 2019-11-07: qty 1000

## 2019-11-07 MED ORDER — OXYCODONE-ACETAMINOPHEN 5-325 MG PO TABS: 2.0000 | ORAL_TABLET | ORAL | Status: DC | PRN

## 2019-11-07 MED ORDER — SOD CITRATE-CITRIC ACID 500-334 MG/5ML PO SOLN: 30.0000 mL | ORAL | Status: DC | PRN

## 2019-11-07 MED ORDER — SODIUM CHLORIDE 0.9% FLUSH: 3.0000 mL | INTRAVENOUS | Status: DC | PRN

## 2019-11-07 MED ORDER — LACTATED RINGERS IV SOLN
500.0000 mL | INTRAVENOUS | Status: DC | PRN
  Administered 2019-11-07: 500 mL via INTRAVENOUS

## 2019-11-07 MED ORDER — TERBUTALINE SULFATE 1 MG/ML IJ SOLN: 0.2500 mg | Freq: Once | INTRAMUSCULAR | Status: DC | PRN

## 2019-11-07 MED ORDER — OXYCODONE-ACETAMINOPHEN 5-325 MG PO TABS: 1.0000 | ORAL_TABLET | ORAL | Status: DC | PRN

## 2019-11-07 MED ORDER — MISOPROSTOL 50MCG HALF TABLET
50.0000 ug | ORAL_TABLET | ORAL | Status: DC
  Administered 2019-11-07: 50 ug via VAGINAL
  Filled 2019-11-07: qty 1

## 2019-11-07 MED ORDER — OXYTOCIN 40 UNITS IN NORMAL SALINE INFUSION - SIMPLE MED
1.0000 m[IU]/min | INTRAVENOUS | Status: DC
  Administered 2019-11-08: 2 m[IU]/min via INTRAVENOUS
  Filled 2019-11-07: qty 1000

## 2019-11-07 MED ORDER — SODIUM CHLORIDE 0.9 % IV SOLN
INTRAVENOUS | Status: DC | PRN
  Administered 2019-11-07 (×3): 5 mL via EPIDURAL

## 2019-11-07 MED ORDER — ACETAMINOPHEN 325 MG PO TABS: 650.0000 mg | ORAL_TABLET | ORAL | Status: DC | PRN

## 2019-11-07 MED ORDER — DIPHENHYDRAMINE HCL 50 MG/ML IJ SOLN: 12.5000 mg | INTRAMUSCULAR | Status: DC | PRN

## 2019-11-07 MED ORDER — LIDOCAINE HCL (PF) 1 % IJ SOLN
30.0000 mL | INTRAMUSCULAR | Status: DC | PRN
  Filled 2019-11-07: qty 30

## 2019-11-07 MED ORDER — SODIUM CHLORIDE 0.9 % IV SOLN: 250.0000 mL | INTRAVENOUS | Status: DC | PRN

## 2019-11-07 MED ORDER — SODIUM CHLORIDE 0.9 % IV SOLN
5.0000 10*6.[IU] | Freq: Once | INTRAVENOUS | Status: AC
  Administered 2019-11-07: 19:00:00 5 10*6.[IU] via INTRAVENOUS
  Filled 2019-11-07: qty 5

## 2019-11-07 MED ORDER — ONDANSETRON HCL 4 MG/2ML IJ SOLN: 4.0000 mg | Freq: Four times a day (QID) | INTRAMUSCULAR | Status: DC | PRN

## 2019-11-07 MED ORDER — FENTANYL 2.5 MCG/ML W/ROPIVACAINE 0.15% IN NS 100 ML EPIDURAL (ARMC)
12.0000 mL/h | EPIDURAL | Status: DC
  Administered 2019-11-07: 12 mL/h via EPIDURAL

## 2019-11-07 MED ORDER — FENTANYL 2.5 MCG/ML W/ROPIVACAINE 0.15% IN NS 100 ML EPIDURAL (ARMC)
EPIDURAL | Status: AC
  Filled 2019-11-07: qty 100

## 2019-11-07 MED ORDER — LIDOCAINE HCL (PF) 1 % IJ SOLN
INTRAMUSCULAR | Status: DC | PRN
  Administered 2019-11-07: 2 mL

## 2019-11-07 NOTE — OB Triage Note (Signed)
Pt. Presented to L/D triage with reported contractions that began at 0430 this morning. She describes the pain as intermittent, rated 7/10 that are unrelieved with rest.  No bleeding. She reports a leaking of clear discharge since 11/04/18. Positive fetal movement. Monitors applied and assessing.

## 2019-11-07 NOTE — Anesthesia Preprocedure Evaluation (Signed)
Anesthesia Evaluation  Patient identified by MRN, date of birth, ID band Patient awake    Reviewed: Allergy & Precautions, H&P , NPO status , Patient's Chart, lab work & pertinent test results  Airway Mallampati: III  TM Distance: >3 FB Neck ROM: full    Dental  (+) Teeth Intact Dental braces:   Pulmonary asthma ,           Cardiovascular Exercise Tolerance: Good (-) hypertensionnegative cardio ROS       Neuro/Psych PSYCHIATRIC DISORDERS Anxiety Depression    GI/Hepatic negative GI ROS,   Endo/Other    Renal/GU   negative genitourinary   Musculoskeletal   Abdominal   Peds  Hematology  (+) Blood dyscrasia, anemia ,   Anesthesia Other Findings Obesity  Past Medical History: No date: Anemia No date: Anxiety No date: Asthma No date: Depression  Past Surgical History: No date: MYRINGOTOMY No date: NO PAST SURGERIES  BMI    Body Mass Index: 35.53 kg/m      Reproductive/Obstetrics (+) Pregnancy                             Anesthesia Physical Anesthesia Plan  ASA: III  Anesthesia Plan: Epidural   Post-op Pain Management:    Induction:   PONV Risk Score and Plan:   Airway Management Planned:   Additional Equipment:   Intra-op Plan:   Post-operative Plan:   Informed Consent: I have reviewed the patients History and Physical, chart, labs and discussed the procedure including the risks, benefits and alternatives for the proposed anesthesia with the patient or authorized representative who has indicated his/her understanding and acceptance.       Plan Discussed with: Anesthesiologist  Anesthesia Plan Comments:         Anesthesia Quick Evaluation

## 2019-11-07 NOTE — Anesthesia Procedure Notes (Signed)
Epidural Patient location during procedure: OB Start time: 11/07/2019 10:56 PM End time: 11/07/2019 11:09 PM  Staffing Anesthesiologist: Karleen Hampshire, MD Performed: anesthesiologist   Preanesthetic Checklist Completed: patient identified, IV checked, site marked, risks and benefits discussed, surgical consent, monitors and equipment checked, pre-op evaluation and timeout performed  Epidural Patient position: sitting Prep: ChloraPrep Patient monitoring: heart rate, continuous pulse ox and blood pressure Approach: midline Location: L3-L4 Injection technique: LOR saline  Needle:  Needle type: Tuohy  Needle gauge: 18 G Needle length: 9 cm and 9 Needle insertion depth: 6 cm Catheter type: closed end flexible Catheter size: 20 Guage Catheter at skin depth: 11 cm Test dose: negative and Other  Assessment Events: blood not aspirated, injection not painful, no injection resistance, no paresthesia and negative IV test  Additional Notes Risks and benefits of procedure discussed with patient.  Risks including but not limited to infection, spinal/epidural hematoma, nerve injury, post dural puncture headache, and inadequate/failed block.  Patient expressed understanding and consented to epidural placement. Negative dural puncture.  Negative aspiration.  Negative paresthesia on injection.  Dose given in divided aliquots.  Patient tolerated the procedure well with no immediate complications.   Reason for block:procedure for pain

## 2019-11-07 NOTE — H&P (Signed)
Obstetric History and Physical  Gabriela Anderson is a 19 y.o. G1P0 with IUP at [redacted]w[redacted]d presenting with prolonged full-term premature rupture of membranes.   Patient states she has been having  irregular, every five (5) to seven (7) minutes contractions, none vaginal bleeding, ruptured, clear fluid membranes since evening of 11/04/2019, with active fetal movement.    Denies difficulty breathing or respiratory distress, chest pain, dysuria, and leg pain or swelling.   Prenatal Course  Source of Care: [facility]-initial visit at 8 weeks, total visits: 12  Pregnancy complications or risks: Teen pregnancy, Anemia in pregnancy, History of pyelonephritis in pregnancy, Rh positive, Echogenic intracardiac focus on fetal ultrasound  Prenatal labs and studies:  ABO, Rh: --/--/O POS (02/13 2120)  Antibody: NEG (02/13 2120)  Rubella: 11.60 (09/17 1507)  Varicella: 275 (09/17 1507)  RPR: Non Reactive (03/26 1631)   HBsAg: Negative (09/17 1507)   HIV: Non Reactive (03/26 1631)   GMW:NUUVOZDG/-- (03/26 1652)  1 hr Glucola: 85 (12/03 0937)  Genetic screening: Low risk female (10/09 1008)  Anatomy US: Complete, normal except for fetal intracardiac focus (12/17 1029)  Past Medical History:  Diagnosis Date  . Anemia   . Anxiety   . Asthma   . Depression     Past Surgical History:  Procedure Laterality Date  . MYRINGOTOMY    . NO PAST SURGERIES      OB History  Gravida Para Term Preterm AB Living  1            SAB TAB Ectopic Multiple Live Births               # Outcome Date GA Lbr Len/2nd Weight Sex Delivery Anes PTL Lv  1 Current             Social History   Socioeconomic History  . Marital status: Single    Spouse name: Not on file  . Number of children: Not on file  . Years of education: Not on file  . Highest education level: Not on file  Occupational History  . Not on file  Tobacco Use  . Smoking status: Never Smoker  . Smokeless tobacco: Never Used  Substance and  Sexual Activity  . Alcohol use: No  . Drug use: No  . Sexual activity: Yes    Comment: planning nexplanon  Other Topics Concern  . Not on file  Social History Narrative  . Not on file   Social Determinants of Health   Financial Resource Strain:   . Difficulty of Paying Living Expenses:   Food Insecurity:   . Worried About Programme researcher, broadcasting/film/video in the Last Year:   . Barista in the Last Year:   Transportation Needs:   . Freight forwarder (Medical):   Marland Kitchen Lack of Transportation (Non-Medical):   Physical Activity:   . Days of Exercise per Week:   . Minutes of Exercise per Session:   Stress:   . Feeling of Stress :   Social Connections:   . Frequency of Communication with Friends and Family:   . Frequency of Social Gatherings with Friends and Family:   . Attends Religious Services:   . Active Member of Clubs or Organizations:   . Attends Banker Meetings:   Marland Kitchen Marital Status:     Family History  Problem Relation Age of Onset  . Heart disease Paternal Grandmother     Medications Prior to Admission  Medication Sig Dispense Refill Last  Dose  . acetaminophen (TYLENOL) 500 MG tablet Take 1,000 mg by mouth every 6 (six) hours as needed for mild pain.   Past Month at Unknown time  . albuterol (VENTOLIN HFA) 108 (90 Base) MCG/ACT inhaler Inhale 2 puffs into the lungs as needed.   Past Month at Unknown time  . cetirizine (ZYRTEC) 10 MG tablet Take 10 mg by mouth as needed.   Past Month at Unknown time  . nitrofurantoin, macrocrystal-monohydrate, (MACROBID) 100 MG capsule Take 1 capsule (100 mg total) by mouth at bedtime. 60 capsule 0 Past Week at Unknown time  . docusate sodium (COLACE) 100 MG capsule Take 1 capsule (100 mg total) by mouth 2 (two) times daily as needed. (Patient not taking: Reported on 11/07/2019) 30 capsule 2 Not Taking at Unknown time  . ferrous sulfate (FERROUSUL) 325 (65 FE) MG tablet Take 1 tablet (325 mg total) by mouth 2 (two) times daily.  (Patient not taking: Reported on 11/07/2019) 60 tablet 1 Not Taking at Unknown time  . metroNIDAZOLE (FLAGYL) 500 MG tablet Take 1 tablet (500 mg total) by mouth 2 (two) times daily. (Patient not taking: Reported on 11/01/2019) 14 tablet 0   . ondansetron (ZOFRAN) 4 MG tablet Take 1 tablet (4 mg total) by mouth daily as needed for nausea or vomiting. (Patient not taking: Reported on 09/26/2019) 30 tablet 1   . Prenatal Vit-Fe Fumarate-FA (MULTIVITAMIN-PRENATAL) 27-0.8 MG TABS tablet Take 1 tablet by mouth daily at 12 noon.       No Known Allergies  Review of Systems: Negative except for what is mentioned in HPI.  Physical Exam:  Temp:  [98.5 F (36.9 C)] 98.5 F (36.9 C) (04/20 1646) Pulse Rate:  [99] 99 (04/20 1646) Resp:  [18] 18 (04/20 1646) BP: (140)/(78) 140/78 (04/20 1646) Weight:  [105.2 kg] 105.2 kg (04/20 1646)  GENERAL: Well-developed, well-nourished female in no acute distress.   LUNGS: Clear to auscultation bilaterally.   HEART: Regular rate and rhythm.  ABDOMEN: Soft, nontender, nondistended, gravid.  EXTREMITIES: Nontender, no edema, 2+ distal pulses.  Cervical Exam: Dilation: 1 Cervical Position: Posterior Station: -2 Presentation: Vertex Exam by:: Stark Klein RN  FHR Category I  Contractions: Irregular contractions, soft resting tone   Pertinent Labs/Studies:   Results for orders placed or performed during the hospital encounter of 11/07/19 (from the past 24 hour(s))  ROM Plus ([facility] only)     Status: None   Collection Time: 11/07/19  5:15 PM  Result Value Ref Range   Rom Plus POSITIVE   Urinalysis, Complete w Microscopic     Status: Abnormal   Collection Time: 11/07/19  5:15 PM  Result Value Ref Range   Color, Urine STRAW (A) YELLOW   APPearance CLEAR (A) CLEAR   Specific Gravity, Urine 1.003 (L) 1.005 - 1.030   pH 7.0 5.0 - 8.0   Glucose, UA NEGATIVE NEGATIVE mg/dL   Hgb urine dipstick NEGATIVE NEGATIVE   Bilirubin Urine NEGATIVE NEGATIVE   Ketones,  ur NEGATIVE NEGATIVE mg/dL   Protein, ur NEGATIVE NEGATIVE mg/dL   Nitrite NEGATIVE NEGATIVE   Leukocytes,Ua NEGATIVE NEGATIVE   WBC, UA 0-5 0 - 5 WBC/hpf   Bacteria, UA NONE SEEN NONE SEEN   Squamous Epithelial / LPF 6-10 0 - 5    Assessment :  PATRICIE GEESLIN is a 19 y.o. G1P0 at [redacted]w[redacted]d being admitted for prolonged full term premature rupture of membranes, Teen pregnancy, Anemia in pregnancy, History of pyelonephritis in pregnancy, Rh positive, Echogenic  intracardiac focus on fetal ultrasound  FHR Category I  Plan:  Admit to birthing suites, see orders.   Induction/Augmentation  per protocol.  IV antibiotics due to prolonged rupture of membranes.   Delivery plan: Hopeful for vaginal birth.   Dr. Valentino Saxon notified of admission and plan of care.    Gunnar Bulla, CNM [facility], [facility] 11/07/19 5:59 PM

## 2019-11-08 ENCOUNTER — Encounter: Payer: Self-pay | Admitting: Obstetrics and Gynecology

## 2019-11-08 DIAGNOSIS — O4212 Full-term premature rupture of membranes, onset of labor more than 24 hours following rupture: Secondary | ICD-10-CM

## 2019-11-08 DIAGNOSIS — D563 Thalassemia minor: Secondary | ICD-10-CM

## 2019-11-08 DIAGNOSIS — O283 Abnormal ultrasonic finding on antenatal screening of mother: Secondary | ICD-10-CM

## 2019-11-08 DIAGNOSIS — Z3A39 39 weeks gestation of pregnancy: Secondary | ICD-10-CM

## 2019-11-08 DIAGNOSIS — O99013 Anemia complicating pregnancy, third trimester: Secondary | ICD-10-CM

## 2019-11-08 LAB — CBC
HCT: 27.6 % — ABNORMAL LOW (ref 36.0–46.0)
Hemoglobin: 9.5 g/dL — ABNORMAL LOW (ref 12.0–15.0)
MCH: 28.7 pg (ref 26.0–34.0)
MCHC: 34.4 g/dL (ref 30.0–36.0)
MCV: 83.4 fL (ref 80.0–100.0)
Platelets: 223 10*3/uL (ref 150–400)
RBC: 3.31 MIL/uL — ABNORMAL LOW (ref 3.87–5.11)
RDW: 13.2 % (ref 11.5–15.5)
WBC: 11.9 10*3/uL — ABNORMAL HIGH (ref 4.0–10.5)
nRBC: 0 % (ref 0.0–0.2)

## 2019-11-08 LAB — RPR: RPR Ser Ql: NONREACTIVE

## 2019-11-08 MED ORDER — SODIUM CHLORIDE 0.9% FLUSH: 3.0000 mL | INTRAVENOUS | Status: DC | PRN

## 2019-11-08 MED ORDER — IBUPROFEN 600 MG PO TABS
600.0000 mg | ORAL_TABLET | Freq: Four times a day (QID) | ORAL | Status: DC
  Administered 2019-11-08 – 2019-11-09 (×5): 600 mg via ORAL
  Filled 2019-11-08 (×6): qty 1

## 2019-11-08 MED ORDER — PRENATAL MULTIVITAMIN CH
1.0000 | ORAL_TABLET | Freq: Every day | ORAL | Status: DC
  Administered 2019-11-08: 12:00:00 1 via ORAL
  Filled 2019-11-08: qty 1

## 2019-11-08 MED ORDER — FERROUS SULFATE 325 (65 FE) MG PO TABS
325.0000 mg | ORAL_TABLET | Freq: Two times a day (BID) | ORAL | Status: DC
  Administered 2019-11-08 – 2019-11-09 (×3): 325 mg via ORAL
  Filled 2019-11-08 (×4): qty 1

## 2019-11-08 MED ORDER — SODIUM CHLORIDE 0.9% FLUSH: 3.0000 mL | Freq: Two times a day (BID) | INTRAVENOUS | Status: DC

## 2019-11-08 MED ORDER — DIBUCAINE (PERIANAL) 1 % EX OINT: 1.0000 "application " | TOPICAL_OINTMENT | CUTANEOUS | Status: DC | PRN

## 2019-11-08 MED ORDER — WITCH HAZEL-GLYCERIN EX PADS: 1.0000 "application " | MEDICATED_PAD | CUTANEOUS | Status: DC | PRN

## 2019-11-08 MED ORDER — DIPHENHYDRAMINE HCL 25 MG PO CAPS: 25.0000 mg | ORAL_CAPSULE | Freq: Four times a day (QID) | ORAL | Status: DC | PRN

## 2019-11-08 MED ORDER — COCONUT OIL OIL
1.0000 "application " | TOPICAL_OIL | Status: DC | PRN
  Administered 2019-11-08: 1 via TOPICAL
  Filled 2019-11-08: qty 120

## 2019-11-08 MED ORDER — ALBUTEROL SULFATE (2.5 MG/3ML) 0.083% IN NEBU: 2.5000 mg | INHALATION_SOLUTION | RESPIRATORY_TRACT | Status: DC | PRN

## 2019-11-08 MED ORDER — ACETAMINOPHEN 325 MG PO TABS
650.0000 mg | ORAL_TABLET | ORAL | Status: DC | PRN
  Administered 2019-11-08: 650 mg via ORAL
  Filled 2019-11-08: qty 2

## 2019-11-08 MED ORDER — SIMETHICONE 80 MG PO CHEW: 80.0000 mg | CHEWABLE_TABLET | ORAL | Status: DC | PRN

## 2019-11-08 MED ORDER — TERBUTALINE SULFATE 1 MG/ML IJ SOLN: 0.2500 mg | Freq: Once | INTRAMUSCULAR | Status: DC | PRN

## 2019-11-08 MED ORDER — SENNOSIDES-DOCUSATE SODIUM 8.6-50 MG PO TABS
2.0000 | ORAL_TABLET | ORAL | Status: DC
  Administered 2019-11-09: 2 via ORAL
  Filled 2019-11-08: qty 2

## 2019-11-08 MED ORDER — SODIUM CHLORIDE 0.9 % IV SOLN: 250.0000 mL | INTRAVENOUS | Status: DC | PRN

## 2019-11-08 MED ORDER — ONDANSETRON HCL 4 MG/2ML IJ SOLN: 4.0000 mg | INTRAMUSCULAR | Status: DC | PRN

## 2019-11-08 MED ORDER — OXYTOCIN 40 UNITS IN NORMAL SALINE INFUSION - SIMPLE MED: 1.0000 m[IU]/min | INTRAVENOUS | Status: DC

## 2019-11-08 MED ORDER — BENZOCAINE-MENTHOL 20-0.5 % EX AERO
1.0000 "application " | INHALATION_SPRAY | CUTANEOUS | Status: DC | PRN
  Administered 2019-11-08: 1 via TOPICAL
  Filled 2019-11-08: qty 56

## 2019-11-08 MED ORDER — ONDANSETRON HCL 4 MG PO TABS: 4.0000 mg | ORAL_TABLET | ORAL | Status: DC | PRN

## 2019-11-08 NOTE — Lactation Note (Signed)
This note was copied from a baby's chart. Lactation Consultation Note  Patient Name: Gabriela Anderson Date: 11/08/2019 Reason for consult: Follow-up assessment  LC in to check on mom and baby. Family is doing well, baby is resting. Family reports feeling comfortable with all assistance and breastfeeding information given today. They had no questions at this time.  LC encouraged feeding on cues, newborn feeding patterns and stomach size, anticipation of 24hr cluster feeding overnight, importance of skin to skin, and calling out with any questions for for assistance.  Maternal Data Does the patient have breastfeeding experience prior to this delivery?: No  Feeding Feeding Type: Breast Fed  LATCH Score                   Interventions    Lactation Tools Discussed/Used     Consult Status Consult Status: Follow-up Date: 11/08/19 Follow-up type: In-patient    Danford Bad 11/08/2019, 2:57 PM

## 2019-11-08 NOTE — Lactation Note (Signed)
This note was copied from a baby's chart. Lactation Consultation Note  Patient Name: Boy Sundae Maners [id number] Date: 11/08/2019 Reason for consult: Initial assessment  Upon entering room MOB and support person (Grandma of Baby), were waiting for baby to come back from circumcision. [facility] student went over what to expect within the first 24 hours with feeding cues, baby after a circ., cluster feeding, and how they should reach out for the first feed after the circ.  MOB and support person felt good about information. Support person asked about engorgement and what they should do if that happens since she breastfed off and on because of engorgement. The [facility] student said when MOB feels fullness that she should put baby to breast if baby is cueing or express milk to relieve the pressure of the fullness. Do not empty because that signals the body to make more milk and we do not want you to over produce. Support person also brought up that baby spit up before leaving for circ and that maybe baby needed to eat. [facility] student reminded them of feeding cues and that he did nto look to be cuing but I will be back later to check in on how he is doing after the circ.  MOB and support person were comfortable with the information given and will be calling out later on today.  Feeding Feeding Type: Breast Fed   Consult Status Consult Status: Follow-up Date: 11/08/19 Follow-up type: In-patient    Thersa Salt [facility] 11/08/2019, 10:10 AM

## 2019-11-08 NOTE — Anesthesia Postprocedure Evaluation (Signed)
Anesthesia Post Note  Patient: Gabriela Anderson  Procedure(s) Performed: AN AD HOC LABOR EPIDURAL  Patient location during evaluation: Mother Baby Anesthesia Type: Epidural Level of consciousness: awake and alert Pain management: pain level controlled Vital Signs Assessment: post-procedure vital signs reviewed and stable Respiratory status: spontaneous breathing, nonlabored ventilation and respiratory function stable Cardiovascular status: stable Postop Assessment: no headache, no backache and epidural receding Anesthetic complications: no     Last Vitals:  Vitals:   11/08/19 0505 11/08/19 0601  BP: (!) 142/78 119/60  Pulse: 92 82  Resp: 18 18  Temp: 36.8 C 37.1 C  SpO2: 99% 100%    Last Pain:  Vitals:   11/08/19 0601  TempSrc: Oral  PainSc:                  Karoline Caldwell

## 2019-11-09 ENCOUNTER — Other Ambulatory Visit: Payer: Self-pay

## 2019-11-09 ENCOUNTER — Encounter: Payer: Medicaid Other | Admitting: Certified Nurse Midwife

## 2019-11-09 MED ORDER — IBUPROFEN 600 MG PO TABS: 600.0000 mg | ORAL_TABLET | Freq: Four times a day (QID) | ORAL | 0 refills | Status: DC

## 2019-11-09 NOTE — Final Progress Note (Signed)
Discharge Day SOAP Note:  Progress Note - Vaginal Delivery  Gabriela Anderson is a 19 y.o. G1P1001 now PP day 1 s/p Vaginal, Spontaneous . Delivery was uncomplicated  Subjective  The patient has the following complaints: has no unusual complaints  Pain is controlled with current medications.   Patient is urinating without difficulty.  She is ambulating well.     Objective  Vital signs: BP 136/84 (BP Location: Right Arm)   Pulse 79   Temp 98.3 F (36.8 C) (Oral)   Resp 18   Ht 5\' 8"  (1.727 m)   Wt 106 kg   LMP 02/04/2019   SpO2 100%   Breastfeeding Unknown   BMI 35.53 kg/m   Physical Exam: Gen: NAD Fundus Fundal Tone: Firm  Lochia Amount: Small        Data Review Labs: Lab Results  Component Value Date   WBC 11.9 (H) 11/08/2019   HGB 9.5 (L) 11/08/2019   HCT 27.6 (L) 11/08/2019   MCV 83.4 11/08/2019   PLT 223 11/08/2019   CBC Latest Ref Rng & Units 11/08/2019 11/07/2019 10/13/2019  WBC 4.0 - 10.5 K/uL 11.9(H) 7.8 7.0  Hemoglobin 12.0 - 15.0 g/dL 10/15/2019) 11.0(L) 11.3  Hematocrit 36.0 - 46.0 % 27.6(L) 31.2(L) 34.0  Platelets 150 - 400 K/uL 223 244 240   O POS  Edinburgh Score: Edinburgh Postnatal Depression Scale Screening Tool 11/09/2019  I have been able to laugh and see the funny side of things. 0  I have looked forward with enjoyment to things. 0  I have blamed myself unnecessarily when things went wrong. 2  I have been anxious or worried for no good reason. 2  I have felt scared or panicky for no good reason. 1  Things have been getting on top of me. 1  I have been so unhappy that I have had difficulty sleeping. 0  I have felt sad or miserable. 1  I have been so unhappy that I have been crying. 0  The thought of harming myself has occurred to me. 0  Edinburgh Postnatal Depression Scale Total 7    Assessment/Plan  Active Problems:   Indication for care in labor and delivery, antepartum   Full-term premature rupture of membranes   Alpha thalassemia  silent carrier   Echogenic intracardiac focus of fetus on prenatal ultrasound   Vaginal delivery    Plan for discharge today.  Discharge Instructions: Per After Visit Summary. Activity: Advance as tolerated. Pelvic rest for 6 weeks.  Also refer to After Visit Summary Diet: Regular Medications: Allergies as of 11/09/2019   No Known Allergies     Medication List    STOP taking these medications   acetaminophen 500 MG tablet Commonly known as: TYLENOL   metroNIDAZOLE 500 MG tablet Commonly known as: FLAGYL   nitrofurantoin (macrocrystal-monohydrate) 100 MG capsule Commonly known as: Macrobid   ondansetron 4 MG tablet Commonly known as: Zofran     TAKE these medications   albuterol 108 (90 Base) MCG/ACT inhaler Commonly known as: VENTOLIN HFA Inhale 2 puffs into the lungs as needed.   cetirizine 10 MG tablet Commonly known as: ZYRTEC Take 10 mg by mouth as needed.   docusate sodium 100 MG capsule Commonly known as: COLACE Take 1 capsule (100 mg total) by mouth 2 (two) times daily as needed.   ferrous sulfate 325 (65 FE) MG tablet Commonly known as: FerrouSul Take 1 tablet (325 mg total) by mouth 2 (two) times daily.   ibuprofen  600 MG tablet Commonly known as: ADVIL Take 1 tablet (600 mg total) by mouth every 6 (six) hours.   multivitamin-prenatal 27-0.8 MG Tabs tablet Take 1 tablet by mouth daily at 12 noon.      Outpatient follow up: 2 week tele visit , 6 week postpartum visit with Dani Gobble, CNM  Postpartum contraception: Nexplanon or depo injections. TO discuss at 2 wk tele visit   Discharged Condition: good  Discharged to: home  Newborn Data: Disposition:home with mother  Apgars: APGAR (1 MIN): 8   APGAR (5 MINS): 9   APGAR (10 MINS):    Baby Feeding: Breast    Philip Aspen, CNM  11/09/2019 8:39 AM

## 2019-11-09 NOTE — Discharge Summary (Signed)
Patient Name: Gabriela Anderson DOB: 16-Jul-2001 MRN: [id number]                            Discharge Summary  Date of Admission: 11/07/2019 Date of Discharge: 11/09/2019 Delivering Provider: Diona Fanti   Admitting Diagnosis: Indication for care in labor and delivery, antepartum [O75.9] Full-term premature rupture of membranes [O42.92] at [redacted]w[redacted]d Secondary diagnosis:  Active Problems:   Indication for care in labor and delivery, antepartum   Full-term premature rupture of membranes   Alpha thalassemia silent carrier   Echogenic intracardiac focus of fetus on prenatal ultrasound   Vaginal delivery  Mode of Delivery: normal spontaneous vaginal delivery              Discharge diagnosis: Term Pregnancy Delivered      Intrapartum Procedures: epidural and laceration Symmetrical bilateral labial lacerations, right side repaired with 3-0 vicryl rapide   Post partum procedures: none  Complications: none                     Discharge Day SOAP Note:  Progress Note - Vaginal Delivery  Gabriela Anderson is a 19 y.o. G1P1001 now PP day 1 s/p Vaginal, Spontaneous . Delivery was uncomplicated  Subjective  The patient has the following complaints: has no unusual complaints  Pain is controlled with current medications.   Patient is urinating without difficulty.  She is ambulating well.     Objective  Vital signs: BP 136/84 (BP Location: Right Arm)   Pulse 79   Temp 98.3 F (36.8 C) (Oral)   Resp 18   Ht 5\' 8"  (1.727 m)   Wt 106 kg   LMP 02/04/2019   SpO2 100%   Breastfeeding Unknown   BMI 35.53 kg/m   Physical Exam: Gen: NAD Fundus Fundal Tone: Firm  Lochia Amount: Small        Data Review Labs: Lab Results  Component Value Date   WBC 11.9 (H) 11/08/2019   HGB 9.5 (L) 11/08/2019   HCT 27.6 (L) 11/08/2019   MCV 83.4 11/08/2019   PLT 223 11/08/2019   CBC Latest Ref Rng & Units 11/08/2019 11/07/2019 10/13/2019  WBC 4.0 - 10.5 K/uL 11.9(H) 7.8 7.0    Hemoglobin 12.0 - 15.0 g/dL 9.5(L) 11.0(L) 11.3  Hematocrit 36.0 - 46.0 % 27.6(L) 31.2(L) 34.0  Platelets 150 - 400 K/uL 223 244 240   O POS  Edinburgh Score: Edinburgh Postnatal Depression Scale Screening Tool 11/09/2019  I have been able to laugh and see the funny side of things. 0  I have looked forward with enjoyment to things. 0  I have blamed myself unnecessarily when things went wrong. 2  I have been anxious or worried for no good reason. 2  I have felt scared or panicky for no good reason. 1  Things have been getting on top of me. 1  I have been so unhappy that I have had difficulty sleeping. 0  I have felt sad or miserable. 1  I have been so unhappy that I have been crying. 0  The thought of harming myself has occurred to me. 0  Edinburgh Postnatal Depression Scale Total 7    Assessment/Plan  Active Problems:   Indication for care in labor and delivery, antepartum   Full-term premature rupture of membranes   Alpha thalassemia silent carrier   Echogenic intracardiac focus of fetus on prenatal ultrasound  Vaginal delivery    Plan for discharge today.  Discharge Instructions: Per After Visit Summary. Activity: Advance as tolerated. Pelvic rest for 6 weeks.  Also refer to After Visit Summary Diet: Regular Medications: Allergies as of 11/09/2019   No Known Allergies     Medication List    STOP taking these medications   acetaminophen 500 MG tablet Commonly known as: TYLENOL   metroNIDAZOLE 500 MG tablet Commonly known as: FLAGYL   nitrofurantoin (macrocrystal-monohydrate) 100 MG capsule Commonly known as: Macrobid   ondansetron 4 MG tablet Commonly known as: Zofran     TAKE these medications   albuterol 108 (90 Base) MCG/ACT inhaler Commonly known as: VENTOLIN HFA Inhale 2 puffs into the lungs as needed.   cetirizine 10 MG tablet Commonly known as: ZYRTEC Take 10 mg by mouth as needed.   docusate sodium 100 MG capsule Commonly known as:  COLACE Take 1 capsule (100 mg total) by mouth 2 (two) times daily as needed.   ferrous sulfate 325 (65 FE) MG tablet Commonly known as: FerrouSul Take 1 tablet (325 mg total) by mouth 2 (two) times daily.   ibuprofen 600 MG tablet Commonly known as: ADVIL Take 1 tablet (600 mg total) by mouth every 6 (six) hours.   multivitamin-prenatal 27-0.8 MG Tabs tablet Take 1 tablet by mouth daily at 12 noon.      Outpatient follow up: 2 week tele visit , 6 week postpartum visit with Serafina Royals, CNM  Postpartum contraception: Nexplanon or depo injections. TO discuss at 2 wk tele visit   Discharged Condition: good  Discharged to: home  Newborn Data: Disposition:home with mother  Apgars: APGAR (1 MIN): 8   APGAR (5 MINS): 9   APGAR (10 MINS):    Baby Feeding: Breast    Doreene Burke, CNM  11/09/2019 8:39 AM

## 2019-11-09 NOTE — Progress Notes (Signed)
Patient discharged home with infant. Discharge instructions and prescriptions given and reviewed with patient. Patient verbalized understanding. Escorted out by auxillary.  

## 2019-11-09 NOTE — Discharge Instructions (Signed)

## 2019-11-10 ENCOUNTER — Other Ambulatory Visit: Payer: Self-pay | Admitting: Obstetrics and Gynecology

## 2019-11-15 DIAGNOSIS — F6381 Intermittent explosive disorder: Secondary | ICD-10-CM | POA: Diagnosis not present

## 2019-11-22 DIAGNOSIS — F6381 Intermittent explosive disorder: Secondary | ICD-10-CM | POA: Diagnosis not present

## 2019-11-24 ENCOUNTER — Ambulatory Visit (INDEPENDENT_AMBULATORY_CARE_PROVIDER_SITE_OTHER): Payer: Medicaid Other | Admitting: Certified Nurse Midwife

## 2019-11-24 ENCOUNTER — Encounter: Payer: Self-pay | Admitting: Certified Nurse Midwife

## 2019-11-24 ENCOUNTER — Other Ambulatory Visit: Payer: Self-pay

## 2019-11-24 DIAGNOSIS — Z1331 Encounter for screening for depression: Secondary | ICD-10-CM

## 2019-11-24 NOTE — Progress Notes (Signed)
GYN ENCOUNTER NOTE  Subjective:       Gabriela Anderson is a 19 y.o. G84P1001 female here for two (2) week postpartum visit.   Status post spontaneous vaginal birth of liveborn female infant on 11/08/2019 at 0253.   Doing well. Wishes to return to work in the next two (2) to three (3) weeks.   Breastfeeding, hand expressing and supplementing with formula.   Bleeding is light without clots.   Plans Nexplanon as contraception. Has not resumed intercourse.   Sleeping for approximately three (3) hours at a time.   Denies difficulty breathing or respiratory distress, chest pain, abdominal, excessive vaginal bleeding, dysuria, and leg pain or swelling.    Gynecologic History  No LMP recorded. (Menstrual status: Lactating).  Contraception: abstinence, plans Nexplanon  Last Pap: N/A  Obstetric History OB History  Gravida Para Term Preterm AB Living  1 1 1     1   SAB TAB Ectopic Multiple Live Births        0 1    # Outcome Date GA Lbr Len/2nd Weight Sex Delivery Anes PTL Lv  1 Term 11/08/19 [redacted]w[redacted]d 21:52 / 00:31 7 lb 4.8 oz (3.31 kg) M Vag-Spont   LIV    Past Medical History:  Diagnosis Date  . Anemia   . Anxiety   . Asthma   . Depression     Past Surgical History:  Procedure Laterality Date  . MYRINGOTOMY    . NO PAST SURGERIES      Current Outpatient Medications on File Prior to Visit  Medication Sig Dispense Refill  . albuterol (VENTOLIN HFA) 108 (90 Base) MCG/ACT inhaler Inhale 2 puffs into the lungs as needed.    . cetirizine (ZYRTEC) 10 MG tablet Take 10 mg by mouth as needed.    . docusate sodium (COLACE) 100 MG capsule Take 1 capsule (100 mg total) by mouth 2 (two) times daily as needed. (Patient not taking: Reported on 11/07/2019) 30 capsule 2  . ferrous sulfate 325 (65 FE) MG tablet TAKE 1 TABLET BY MOUTH TWICE A DAY 60 tablet 1  . ibuprofen (ADVIL) 600 MG tablet Take 1 tablet (600 mg total) by mouth every 6 (six) hours. 30 tablet 0  . Prenatal Vit-Fe Fumarate-FA  (MULTIVITAMIN-PRENATAL) 27-0.8 MG TABS tablet Take 1 tablet by mouth daily at 12 noon.     No current facility-administered medications on file prior to visit.    No Known Allergies  Social History   Socioeconomic History  . Marital status: Single    Spouse name: Not on file  . Number of children: Not on file  . Years of education: Not on file  . Highest education level: Not on file  Occupational History  . Not on file  Tobacco Use  . Smoking status: Never Smoker  . Smokeless tobacco: Never Used  Substance and Sexual Activity  . Alcohol use: No  . Drug use: No  . Sexual activity: Yes    Comment: planning nexplanon  Other Topics Concern  . Not on file  Social History Narrative  . Not on file   Social Determinants of Health   Financial Resource Strain:   . Difficulty of Paying Living Expenses:   Food Insecurity:   . Worried About Charity fundraiser in the Last Year:   . Arboriculturist in the Last Year:   Transportation Needs:   . Film/video editor (Medical):   Marland Kitchen Lack of Transportation (Non-Medical):   Physical Activity:   .  Days of Exercise per Week:   . Minutes of Exercise per Session:   Stress:   . Feeling of Stress :   Social Connections:   . Frequency of Communication with Friends and Family:   . Frequency of Social Gatherings with Friends and Family:   . Attends Religious Services:   . Active Member of Clubs or Organizations:   . Attends Banker Meetings:   Marland Kitchen Marital Status:   Intimate Partner Violence:   . Fear of Current or Ex-Partner:   . Emotionally Abused:   Marland Kitchen Physically Abused:   . Sexually Abused:     Family History  Problem Relation Age of Onset  . Heart disease Paternal Grandmother     The following portions of the patient's history were reviewed and updated as appropriate: allergies, current medications, past family history, past medical history, past social history, past surgical history and problem list.  Review of  Systems  ROS negative except as noted above. Information obtained from patient.   Objective:   BP 118/87   Pulse (!) 110   Ht 5\' 8"  (1.727 m)   Wt 210 lb 1 oz (95.3 kg)   BMI 31.94 kg/m    CONSTITUTIONAL: Well-developed, well-nourished female in no acute distress.   PHYSICAL EXAM: Not indicated.   Depression screen [facility] 2/9 11/24/2019 08/08/2019  Decreased Interest 0 1  Down, Depressed, Hopeless 0 1  PHQ - 2 Score 0 2  Altered sleeping 0 0  Tired, decreased energy 0 3  Change in appetite 0 1  Feeling bad or failure about yourself  0 3  Trouble concentrating 0 1  Moving slowly or fidgety/restless 0 0  Suicidal thoughts 0 0  PHQ-9 Score 0 10  Difficult doing work/chores - Not difficult at all    Assessment:   1. Postpartum care and examination   2. Lactating mother   3. Depression screening negative  Plan:   Routine postpartum education.   Encouraged to contact [facility] regarding availability of breast pumps.   Reviewed red flag symptoms and when to call.   RTC as previously scheduled or sooner if needed.    [facility], CNM [facility], [facility] 11/24/19 12:41 PM

## 2019-11-24 NOTE — Patient Instructions (Addendum)
Postpartum Care After Vaginal Delivery This sheet gives you information about how to care for yourself from the time you deliver your baby to up to 6-12 weeks after delivery (postpartum period). Your health care provider may also give you more specific instructions. If you have problems or questions, contact your health care provider. Follow these instructions at home: Vaginal bleeding  It is normal to have vaginal bleeding (lochia) after delivery. Wear a sanitary pad for vaginal bleeding and discharge. ? During the first week after delivery, the amount and appearance of lochia is often similar to a menstrual period. ? Over the next few weeks, it will gradually decrease to a dry, yellow-brown discharge. ? For most women, lochia stops completely by 4-6 weeks after delivery. Vaginal bleeding can vary from woman to woman.  Change your sanitary pads frequently. Watch for any changes in your flow, such as: ? A sudden increase in volume. ? A change in color. ? Large blood clots.  If you pass a blood clot from your vagina, save it and call your health care provider to discuss. Do not flush blood clots down the toilet before talking with your health care provider.  Do not use tampons or douches until your health care provider says this is safe.  If you are not breastfeeding, your period should return 6-8 weeks after delivery. If you are feeding your child breast milk only (exclusive breastfeeding), your period may not return until you stop breastfeeding. Perineal care  Keep the area between the vagina and the anus (perineum) clean and dry as told by your health care provider. Use medicated pads and pain-relieving sprays and creams as directed.  If you had a cut in the perineum (episiotomy) or a tear in the vagina, check the area for signs of infection until you are healed. Check for: ? More redness, swelling, or pain. ? Fluid or blood coming from the cut or tear. ? Warmth. ? Pus or a bad  smell.  You may be given a squirt bottle to use instead of wiping to clean the perineum area after you go to the bathroom. As you start healing, you may use the squirt bottle before wiping yourself. Make sure to wipe gently.  To relieve pain caused by an episiotomy, a tear in the vagina, or swollen veins in the anus (hemorrhoids), try taking a warm sitz bath 2-3 times a day. A sitz bath is a warm water bath that is taken while you are sitting down. The water should only come up to your hips and should cover your buttocks. Breast care  Within the first few days after delivery, your breasts may feel heavy, full, and uncomfortable (breast engorgement). Milk may also leak from your breasts. Your health care provider can suggest ways to help relieve the discomfort. Breast engorgement should go away within a few days.  If you are breastfeeding: ? Wear a bra that supports your breasts and fits you well. ? Keep your nipples clean and dry. Apply creams and ointments as told by your health care provider. ? You may need to use breast pads to absorb milk that leaks from your breasts. ? You may have uterine contractions every time you breastfeed for up to several weeks after delivery. Uterine contractions help your uterus return to its normal size. ? If you have any problems with breastfeeding, work with your health care provider or lactation consultant.  If you are not breastfeeding: ? Avoid touching your breasts a lot. Doing this can make   your breasts produce more milk. ? Wear a good-fitting bra and use cold packs to help with swelling. ? Do not squeeze out (express) milk. This causes you to make more milk. Intimacy and sexuality  Ask your health care provider when you can engage in sexual activity. This may depend on: ? Your risk of infection. ? How fast you are healing. ? Your comfort and desire to engage in sexual activity.  You are able to get pregnant after delivery, even if you have not had  your period. If desired, talk with your health care provider about methods of birth control (contraception). Medicines  Take over-the-counter and prescription medicines only as told by your health care provider.  If you were prescribed an antibiotic medicine, take it as told by your health care provider. Do not stop taking the antibiotic even if you start to feel better. Activity  Gradually return to your normal activities as told by your health care provider. Ask your health care provider what activities are safe for you.  Rest as much as possible. Try to rest or take a nap while your baby is sleeping. Eating and drinking   Drink enough fluid to keep your urine pale yellow.  Eat high-fiber foods every day. These may help prevent or relieve constipation. High-fiber foods include: ? Whole grain cereals and breads. ? Brown rice. ? Beans. ? Fresh fruits and vegetables.  Do not try to lose weight quickly by cutting back on calories.  Take your prenatal vitamins until your postpartum checkup or until your health care provider tells you it is okay to stop. Lifestyle  Do not use any products that contain nicotine or tobacco, such as cigarettes and e-cigarettes. If you need help quitting, ask your health care provider.  Do not drink alcohol, especially if you are breastfeeding. General instructions  Keep all follow-up visits for you and your baby as told by your health care provider. Most women visit their health care provider for a postpartum checkup within the first 3-6 weeks after delivery. Contact a health care provider if:  You feel unable to cope with the changes that your child brings to your life, and these feelings do not go away.  You feel unusually sad or worried.  Your breasts become red, painful, or hard.  You have a fever.  You have trouble holding urine or keeping urine from leaking.  You have little or no interest in activities you used to enjoy.  You have not  breastfed at all and you have not had a menstrual period for 12 weeks after delivery.  You have stopped breastfeeding and you have not had a menstrual period for 12 weeks after you stopped breastfeeding.  You have questions about caring for yourself or your baby.  You pass a blood clot from your vagina. Get help right away if:  You have chest pain.  You have difficulty breathing.  You have sudden, severe leg pain.  You have severe pain or cramping in your lower abdomen.  You bleed from your vagina so much that you fill more than one sanitary pad in one hour. Bleeding should not be heavier than your heaviest period.  You develop a severe headache.  You faint.  You have blurred vision or spots in your vision.  You have bad-smelling vaginal discharge.  You have thoughts about hurting yourself or your baby. If you ever feel like you may hurt yourself or others, or have thoughts about taking your own life, get help  right away. You can go to the nearest emergency department or call:  Your local emergency services (911 in the [place]  A suicide crisis helpline, such as the National Suicide Prevention Lifeline at 610-134-5286. This is open 24 hours a day. Summary  The period of time right after you deliver your newborn up to 6-12 weeks after delivery is called the postpartum period.  Gradually return to your normal activities as told by your health care provider.  Keep all follow-up visits for you and your baby as told by your health care provider. This information is not intended to replace advice given to you by your health care provider. Make sure you discuss any questions you have with your health care provider. Document Revised: 07/09/2017 Document Reviewed: 04/19/2017 Elsevier Patient Education  2020 [employer].   Etonogestrel implant What is this medicine? ETONOGESTREL (et oh noe JES trel) is a contraceptive (birth control) device. It is used to prevent pregnancy. It  can be used for up to 3 years. This medicine may be used for other purposes; ask your health care provider or pharmacist if you have questions. COMMON BRAND NAME(S): Implanon, Nexplanon What should I tell my health care provider before I take this medicine? They need to know if you have any of these conditions:  abnormal vaginal bleeding  blood vessel disease or blood clots  breast, cervical, endometrial, ovarian, liver, or uterine cancer  diabetes  gallbladder disease  heart disease or recent heart attack  high blood pressure  high cholesterol or triglycerides  kidney disease  liver disease  migraine headaches  seizures  stroke  tobacco smoker  an unusual or allergic reaction to etonogestrel, anesthetics or antiseptics, other medicines, foods, dyes, or preservatives  pregnant or trying to get pregnant  breast-feeding How should I use this medicine? This device is inserted just under the skin on the inner side of your upper arm by a health care professional. Talk to your pediatrician regarding the use of this medicine in children. Special care may be needed. Overdosage: If you think you have taken too much of this medicine contact a poison control center or emergency room at once. NOTE: This medicine is only for you. Do not share this medicine with others. What if I miss a dose? This does not apply. What may interact with this medicine? Do not take this medicine with any of the following medications:  amprenavir  fosamprenavir This medicine may also interact with the following medications:  acitretin  aprepitant  armodafinil  bexarotene  bosentan  carbamazepine  certain medicines for fungal infections like fluconazole, ketoconazole, itraconazole and voriconazole  certain medicines to treat hepatitis, HIV or  AIDS  cyclosporine  felbamate  griseofulvin  lamotrigine  modafinil  oxcarbazepine  phenobarbital  phenytoin  primidone  rifabutin  rifampin  rifapentine  St. John's wort  topiramate This list may not describe all possible interactions. Give your health care provider a list of all the medicines, herbs, non-prescription drugs, or dietary supplements you use. Also tell them if you smoke, drink alcohol, or use illegal drugs. Some items may interact with your medicine. What should I watch for while using this medicine? This product does not protect you against HIV infection (AIDS) or other sexually transmitted diseases. You should be able to feel the implant by pressing your fingertips over the skin where it was inserted. Contact your doctor if you cannot feel the implant, and use a non-hormonal birth control method (such as condoms) until your doctor  confirms that the implant is in place. Contact your doctor if you think that the implant may have broken or become bent while in your arm. You will receive a user card from your health care provider after the implant is inserted. The card is a record of the location of the implant in your upper arm and when it should be removed. Keep this card with your health records. What side effects may I notice from receiving this medicine? Side effects that you should report to your doctor or health care professional as soon as possible:  allergic reactions like skin rash, itching or hives, swelling of the face, lips, or tongue  breast lumps, breast tissue changes, or discharge  breathing problems  changes in emotions or moods  coughing up blood  if you feel that the implant may have broken or bent while in your arm  high blood pressure  pain, irritation, swelling, or bruising at the insertion site  scar at site of insertion  signs of infection at the insertion site such as fever, and skin redness, pain or discharge  signs and  symptoms of a blood clot such as breathing problems; changes in vision; chest pain; severe, sudden headache; pain, swelling, warmth in the leg; trouble speaking; sudden numbness or weakness of the face, arm or leg  signs and symptoms of liver injury like dark yellow or brown urine; general ill feeling or flu-like symptoms; light-colored stools; loss of appetite; nausea; right upper belly pain; unusually weak or tired; yellowing of the eyes or skin  unusual vaginal bleeding, discharge Side effects that usually do not require medical attention (report to your doctor or health care professional if they continue or are bothersome):  acne  breast pain or tenderness  headache  irregular menstrual bleeding  nausea This list may not describe all possible side effects. Call your doctor for medical advice about side effects. You may report side effects to FDA at 1-800-FDA-1088. Where should I keep my medicine? This drug is given in a hospital or clinic and will not be stored at home. NOTE: This sheet is a summary. It may not cover all possible information. If you have questions about this medicine, talk to your doctor, pharmacist, or health care provider.  2020 Elsevier/Gold Standard (2019-04-18 11:33:04)  Breastfeeding and Returning to Work Returning to work after having a baby can be a challenging time. However, this does not mean that you need to stop breastfeeding. Take steps to plan for your return to work so you can continue breastfeeding. What can I do to prepare for my return to work? Before you return to work:  Take a breastfeeding class or meet with a lactation consultant to discuss your options for continuing to breastfeed.  Let your employer know that you plan to continue to breastfeed. Find out if your work offers: ? A lactation support program. ? A lactation room or other area that is set aside for breastfeeding mothers. ? A refrigerator or other place where you can store breast  milk while you are at work.  Discuss your work schedule with your employer. You may have options for breast pumping breaks or flexible shifts, depending on workplace policies.  If possible, find a child care option that is close to work. This allows you to visit and breastfeed your baby during lunch or breaks. Try to take as many weeks off from work as you can. This can help your body recover and establish a breastfeeding routine. What are some  tips for transitioning my baby to bottle-feeding?   Practice pumping breast milk several days before you go back to work. Doing this will allow you to develop techniques that work for you, become comfortable with pumping, and build up your milk supply. It will also give your baby time to get used to drinking pumped milk from a bottle.  Pump breast milk when you are away from your baby. This may be during your baby's naps or when someone else is looking after your child.  Encourage your baby to drink pumped milk out of a bottle. It may be easier and less confusing for your baby if someone else offers the bottle for the first time.  When your baby learns to drink from a bottle and is able to get enough nutrition from a bottle feeding, gradually replace feedings that take place during your working hours with bottle feedings.  Pay attention to your child's behaviors and emotions. Consider replacing breastfeeding with a soothing activity. Providing a routine can help your baby feel secure. What are some tips for breast pumping at work? Preparing to pump  Tell your employer that you will need to take breaks to pump. Ask if your employer: ? Can provide a private place to pump that is not a bathroom. ? Has a multi-use pump available. This is a pump that can be shared by several women.  Bring these items with you to work: ? A breast pump. ? Milk storage containers. ? A cooler with ice packs, if a refrigerator is not available at your workplace. ? Extra  batteries for a battery-powered breast pump. ? An extra shirt or sweater in case your breasts leak milk. ? A shawl or towel to provide some privacy if you have to pump in a public area. ? Healthy snacks. ? A water bottle. ? Items to help you relax, such as music or a book. ? One of your baby's blankets or a photo of your baby. This can help to trigger your let-down reflex if you are having trouble producing milk. Storing breast milk  If possible, store pumped milk in a refrigerator or in a cooler with ice packs.  If you store your milk in a refrigerator, make sure to label it with your name and the date that you pumped it.  The amount of time that you can keep breast milk depends on where it is stored: ? At room temperature, breast milk is best if used within 4 hours. If pump parts and containers are well cleaned, breast milk may be kept at room temperature for up to 6-8 hours. ? In a chilled cooler with ice packs, breast milk can be stored for up to 24 hours. ? In the refrigerator, breast milk is best if used within 3 days. If pump parts and containers are well cleaned, breast milk may be stored in the refrigerator for up to 5-8 days. General tips  To avoid needing to add formula feedings (supplement) to breast milk feedings, pump at the times when you usually feed your baby. The more often you pump, the more milk you will produce.  When you are away from your baby, pump every 2-3 hours for about 15 minutes.  Pump both breasts at the same time, if you can. Summary  You can continue to breastfeed when you return to work. Let your employer know that you plan to continue breastfeeding, and find out about resources and flexible scheduling options that are available to you at your  workplace.  Practice pumping breast milk several days before you go back to work. Doing this will allow you to develop techniques that work for you, become comfortable with pumping, and build up your milk supply. It  will also give your baby time to get used to drinking pumped milk from a bottle. This information is not intended to replace advice given to you by your health care provider. Make sure you discuss any questions you have with your health care provider. Document Revised: 08/07/2017 Document Reviewed: 11/27/2016 Elsevier Patient Education  2020 [employer].

## 2019-11-29 DIAGNOSIS — F6381 Intermittent explosive disorder: Secondary | ICD-10-CM | POA: Diagnosis not present

## 2019-12-07 ENCOUNTER — Other Ambulatory Visit: Payer: Self-pay | Admitting: Obstetrics and Gynecology

## 2019-12-07 DIAGNOSIS — F6381 Intermittent explosive disorder: Secondary | ICD-10-CM | POA: Diagnosis not present

## 2019-12-13 DIAGNOSIS — F6381 Intermittent explosive disorder: Secondary | ICD-10-CM | POA: Diagnosis not present

## 2019-12-20 DIAGNOSIS — F331 Major depressive disorder, recurrent, moderate: Secondary | ICD-10-CM | POA: Diagnosis not present

## 2019-12-20 DIAGNOSIS — F411 Generalized anxiety disorder: Secondary | ICD-10-CM | POA: Diagnosis not present

## 2019-12-22 ENCOUNTER — Ambulatory Visit (INDEPENDENT_AMBULATORY_CARE_PROVIDER_SITE_OTHER): Payer: Medicaid Other | Admitting: Certified Nurse Midwife

## 2019-12-22 ENCOUNTER — Encounter: Payer: Self-pay | Admitting: Certified Nurse Midwife

## 2019-12-22 ENCOUNTER — Other Ambulatory Visit: Payer: Self-pay

## 2019-12-22 DIAGNOSIS — Z1331 Encounter for screening for depression: Secondary | ICD-10-CM

## 2019-12-22 DIAGNOSIS — Z6831 Body mass index (BMI) 31.0-31.9, adult: Secondary | ICD-10-CM

## 2019-12-22 NOTE — Patient Instructions (Addendum)
Etonogestrel implant What is this medicine? ETONOGESTREL (et oh noe JES trel) is a contraceptive (birth control) device. It is used to prevent pregnancy. It can be used for up to 3 years. This medicine may be used for other purposes; ask your health care provider or pharmacist if you have questions. COMMON BRAND NAME(S): Implanon, Nexplanon What should I tell my health care provider before I take this medicine? They need to know if you have any of these conditions:  abnormal vaginal bleeding  blood vessel disease or blood clots  breast, cervical, endometrial, ovarian, liver, or uterine cancer  diabetes  gallbladder disease  heart disease or recent heart attack  high blood pressure  high cholesterol or triglycerides  kidney disease  liver disease  migraine headaches  seizures  stroke  tobacco smoker  an unusual or allergic reaction to etonogestrel, anesthetics or antiseptics, other medicines, foods, dyes, or preservatives  pregnant or trying to get pregnant  breast-feeding How should I use this medicine? This device is inserted just under the skin on the inner side of your upper arm by a health care professional. Talk to your pediatrician regarding the use of this medicine in children. Special care may be needed. Overdosage: If you think you have taken too much of this medicine contact a poison control center or emergency room at once. NOTE: This medicine is only for you. Do not share this medicine with others. What if I miss a dose? This does not apply. What may interact with this medicine? Do not take this medicine with any of the following medications:  amprenavir  fosamprenavir This medicine may also interact with the following medications:  acitretin  aprepitant  armodafinil  bexarotene  bosentan  carbamazepine  certain medicines for fungal infections like fluconazole, ketoconazole, itraconazole and voriconazole  certain medicines to treat  hepatitis, HIV or AIDS  cyclosporine  felbamate  griseofulvin  lamotrigine  modafinil  oxcarbazepine  phenobarbital  phenytoin  primidone  rifabutin  rifampin  rifapentine  St. John's wort  topiramate This list may not describe all possible interactions. Give your health care provider a list of all the medicines, herbs, non-prescription drugs, or dietary supplements you use. Also tell them if you smoke, drink alcohol, or use illegal drugs. Some items may interact with your medicine. What should I watch for while using this medicine? This product does not protect you against HIV infection (AIDS) or other sexually transmitted diseases. You should be able to feel the implant by pressing your fingertips over the skin where it was inserted. Contact your doctor if you cannot feel the implant, and use a non-hormonal birth control method (such as condoms) until your doctor confirms that the implant is in place. Contact your doctor if you think that the implant may have broken or become bent while in your arm. You will receive a user card from your health care provider after the implant is inserted. The card is a record of the location of the implant in your upper arm and when it should be removed. Keep this card with your health records. What side effects may I notice from receiving this medicine? Side effects that you should report to your doctor or health care professional as soon as possible:  allergic reactions like skin rash, itching or hives, swelling of the face, lips, or tongue  breast lumps, breast tissue changes, or discharge  breathing problems  changes in emotions or moods  coughing up blood  if you feel that the implant   may have broken or bent while in your arm  high blood pressure  pain, irritation, swelling, or bruising at the insertion site  scar at site of insertion  signs of infection at the insertion site such as fever, and skin redness, pain or  discharge  signs and symptoms of a blood clot such as breathing problems; changes in vision; chest pain; severe, sudden headache; pain, swelling, warmth in the leg; trouble speaking; sudden numbness or weakness of the face, arm or leg  signs and symptoms of liver injury like dark yellow or brown urine; general ill feeling or flu-like symptoms; light-colored stools; loss of appetite; nausea; right upper belly pain; unusually weak or tired; yellowing of the eyes or skin  unusual vaginal bleeding, discharge Side effects that usually do not require medical attention (report to your doctor or health care professional if they continue or are bothersome):  acne  breast pain or tenderness  headache  irregular menstrual bleeding  nausea This list may not describe all possible side effects. Call your doctor for medical advice about side effects. You may report side effects to FDA at 1-800-FDA-1088. Where should I keep my medicine? This drug is given in a hospital or clinic and will not be stored at home. NOTE: This sheet is a summary. It may not cover all possible information. If you have questions about this medicine, talk to your doctor, pharmacist, or health care provider.  2020 Elsevier/Gold Standard (2019-04-18 11:33:04)   Postpartum Baby Blues The postpartum period begins right after the birth of a baby. During this time, there is often a lot of joy and excitement. It is also a time of many changes in the life of the parents. No matter how many times a mother gives birth, each child brings new challenges to the family, including different ways of relating to one another. It is common to have feelings of excitement along with confusing changes in moods, emotions, and thoughts. You may feel happy one minute and sad or stressed the next. These feelings of sadness usually happen in the period right after you have your baby, and they go away within a week or two. This is called the "baby  blues." What are the causes? There is no known cause of baby blues. It is likely caused by a combination of factors. However, changes in hormone levels after childbirth are believed to trigger some of the symptoms. Other factors that can play a role in these mood changes include:  Lack of sleep.  Stressful life events, such as poverty, caring for a loved one, or death of a loved one.  Genetics. What are the signs or symptoms? Symptoms of this condition include:  Brief changes in mood, such as going from extreme happiness to sadness.  Decreased concentration.  Difficulty sleeping.  Crying spells and tearfulness.  Loss of appetite.  Irritability.  Anxiety. If the symptoms of baby blues last for more than 2 weeks or become more severe, you may have postpartum depression. How is this diagnosed? This condition is diagnosed based on an evaluation of your symptoms. There are no medical or lab tests that lead to a diagnosis, but there are various questionnaires that a health care provider may use to identify women with the baby blues or postpartum depression. How is this treated? Treatment is not needed for this condition. The baby blues usually go away on their own in 1-2 weeks. Social support is often all that is needed. You will be encouraged to get  adequate sleep and rest. Follow these instructions at home: Lifestyle      Get as much rest as you can. Take a nap when the baby sleeps.  Exercise regularly as told by your health care provider. Some women find yoga and walking to be helpful.  Eat a balanced and nourishing diet. This includes plenty of fruits and vegetables, whole grains, and lean proteins.  Do little things that you enjoy. Have a cup of tea, take a bubble bath, read your favorite magazine, or listen to your favorite music.  Avoid alcohol.  Ask for help with household chores, cooking, grocery shopping, or running errands. Do not try to do everything yourself.  Consider hiring a postpartum doula to help. This is a professional who specializes in providing support to new mothers.  Try not to make any major life changes during pregnancy or right after giving birth. This can add stress. General instructions  Talk to people close to you about how you are feeling. Get support from your partner, family members, friends, or other new moms. You may want to join a support group.  Find ways to cope with stress. This may include: ? Writing your thoughts and feelings in a journal. ? Spending time outside. ? Spending time with people who make you laugh.  Try to stay positive in how you think. Think about the things you are grateful for.  Take over-the-counter and prescription medicines only as told by your health care provider.  Let your health care provider know if you have any concerns.  Keep all postpartum visits as told by your health care provider. This is important. Contact a health care provider if:  Your baby blues do not go away after 2 weeks. Get help right away if:  You have thoughts of taking your own life (suicidal thoughts).  You think you may harm the baby or other people.  You see or hear things that are not there (hallucinations). Summary  After giving birth, you may feel happy one minute and sad or stressed the next. Feelings of sadness that happen right after the baby is born and go away after a week or two are called the "baby blues."  You can manage the baby blues by getting enough rest, eating a healthy diet, exercising, spending time with supportive people, and finding ways to cope with stress.  If feelings of sadness and stress last longer than 2 weeks or get in the way of caring for your baby, talk to your health care provider. This may mean you have postpartum depression. This information is not intended to replace advice given to you by your health care provider. Make sure you discuss any questions you have with your health  care provider. Document Revised: 10/28/2018 Document Reviewed: 09/01/2016 Elsevier Patient Education  2020 Elsevier Inc.   Postpartum Care After Vaginal Delivery This sheet gives you information about how to care for yourself from the time you deliver your baby to up to 6-12 weeks after delivery (postpartum period). Your health care provider may also give you more specific instructions. If you have problems or questions, contact your health care provider. Follow these instructions at home: Vaginal bleeding  It is normal to have vaginal bleeding (lochia) after delivery. Wear a sanitary pad for vaginal bleeding and discharge. ? During the first week after delivery, the amount and appearance of lochia is often similar to a menstrual period. ? Over the next few weeks, it will gradually decrease to a dry, yellow-brown discharge. ?  For most women, lochia stops completely by 4-6 weeks after delivery. Vaginal bleeding can vary from woman to woman.  Change your sanitary pads frequently. Watch for any changes in your flow, such as: ? A sudden increase in volume. ? A change in color. ? Large blood clots.  If you pass a blood clot from your vagina, save it and call your health care provider to discuss. Do not flush blood clots down the toilet before talking with your health care provider.  Do not use tampons or douches until your health care provider says this is safe.  If you are not breastfeeding, your period should return 6-8 weeks after delivery. If you are feeding your child breast milk only (exclusive breastfeeding), your period may not return until you stop breastfeeding. Perineal care  Keep the area between the vagina and the anus (perineum) clean and dry as told by your health care provider. Use medicated pads and pain-relieving sprays and creams as directed.  If you had a cut in the perineum (episiotomy) or a tear in the vagina, check the area for signs of infection until you are healed.  Check for: ? More redness, swelling, or pain. ? Fluid or blood coming from the cut or tear. ? Warmth. ? Pus or a bad smell.  You may be given a squirt bottle to use instead of wiping to clean the perineum area after you go to the bathroom. As you start healing, you may use the squirt bottle before wiping yourself. Make sure to wipe gently.  To relieve pain caused by an episiotomy, a tear in the vagina, or swollen veins in the anus (hemorrhoids), try taking a warm sitz bath 2-3 times a day. A sitz bath is a warm water bath that is taken while you are sitting down. The water should only come up to your hips and should cover your buttocks. Breast care  Within the first few days after delivery, your breasts may feel heavy, full, and uncomfortable (breast engorgement). Milk may also leak from your breasts. Your health care provider can suggest ways to help relieve the discomfort. Breast engorgement should go away within a few days.  If you are breastfeeding: ? Wear a bra that supports your breasts and fits you well. ? Keep your nipples clean and dry. Apply creams and ointments as told by your health care provider. ? You may need to use breast pads to absorb milk that leaks from your breasts. ? You may have uterine contractions every time you breastfeed for up to several weeks after delivery. Uterine contractions help your uterus return to its normal size. ? If you have any problems with breastfeeding, work with your health care provider or Advertising copywriter.  If you are not breastfeeding: ? Avoid touching your breasts a lot. Doing this can make your breasts produce more milk. ? Wear a good-fitting bra and use cold packs to help with swelling. ? Do not squeeze out (express) milk. This causes you to make more milk. Intimacy and sexuality  Ask your health care provider when you can engage in sexual activity. This may depend on: ? Your risk of infection. ? How fast you are healing. ? Your  comfort and desire to engage in sexual activity.  You are able to get pregnant after delivery, even if you have not had your period. If desired, talk with your health care provider about methods of birth control (contraception). Medicines  Take over-the-counter and prescription medicines only as told by your  health care provider.  If you were prescribed an antibiotic medicine, take it as told by your health care provider. Do not stop taking the antibiotic even if you start to feel better. Activity  Gradually return to your normal activities as told by your health care provider. Ask your health care provider what activities are safe for you.  Rest as much as possible. Try to rest or take a nap while your baby is sleeping. Eating and drinking   Drink enough fluid to keep your urine pale yellow.  Eat high-fiber foods every day. These may help prevent or relieve constipation. High-fiber foods include: ? Whole grain cereals and breads. ? Brown rice. ? Beans. ? Fresh fruits and vegetables.  Do not try to lose weight quickly by cutting back on calories.  Take your prenatal vitamins until your postpartum checkup or until your health care provider tells you it is okay to stop. Lifestyle  Do not use any products that contain nicotine or tobacco, such as cigarettes and e-cigarettes. If you need help quitting, ask your health care provider.  Do not drink alcohol, especially if you are breastfeeding. General instructions  Keep all follow-up visits for you and your baby as told by your health care provider. Most women visit their health care provider for a postpartum checkup within the first 3-6 weeks after delivery. Contact a health care provider if:  You feel unable to cope with the changes that your child brings to your life, and these feelings do not go away.  You feel unusually sad or worried.  Your breasts become red, painful, or hard.  You have a fever.  You have trouble holding  urine or keeping urine from leaking.  You have little or no interest in activities you used to enjoy.  You have not breastfed at all and you have not had a menstrual period for 12 weeks after delivery.  You have stopped breastfeeding and you have not had a menstrual period for 12 weeks after you stopped breastfeeding.  You have questions about caring for yourself or your baby.  You pass a blood clot from your vagina. Get help right away if:  You have chest pain.  You have difficulty breathing.  You have sudden, severe leg pain.  You have severe pain or cramping in your lower abdomen.  You bleed from your vagina so much that you fill more than one sanitary pad in one hour. Bleeding should not be heavier than your heaviest period.  You develop a severe headache.  You faint.  You have blurred vision or spots in your vision.  You have bad-smelling vaginal discharge.  You have thoughts about hurting yourself or your baby. If you ever feel like you may hurt yourself or others, or have thoughts about taking your own life, get help right away. You can go to the nearest emergency department or call:  Your local emergency services (911 in the [place]  A suicide crisis helpline, such as the National Suicide Prevention Lifeline at 810-822-4690. This is open 24 hours a day. Summary  The period of time right after you deliver your newborn up to 6-12 weeks after delivery is called the postpartum period.  Gradually return to your normal activities as told by your health care provider.  Keep all follow-up visits for you and your baby as told by your health care provider. This information is not intended to replace advice given to you by your health care provider. Make sure you discuss any  questions you have with your health care provider. Document Revised: 07/09/2017 Document Reviewed: 04/19/2017 Elsevier Patient Education  2020 [employer].

## 2019-12-22 NOTE — Progress Notes (Signed)
Subjective:    Gabriela Anderson is a 19 y.o. G11P1001 African American female who presents for a postpartum visit. She is 6 weeks postpartum following a spontaneous vaginal delivery at 39.4 gestational weeks. Anesthesia: epidural. I have fully reviewed the prenatal and intrapartum course.   Postpartum course has been uncomplicated.   Baby's course has been going well. Longest stretch of sleep is 3 hours. Recently diagnosed with ear infection. Completed course of Amoxicillin and has follow up visit with Pediatrics on Monday. Baby is feeding by both breast and bottle - Gerber soothe gentleease.   Bleeding no bleeding. Bowel function is normal. Bladder function is normal.   Patient is not sexually active. Contraception method is abstinence and Lactating mother, and has appointment on 12/29/19 for Nexplanon.   Postpartum depression screening: negative. Score 3. Reports getting frustrated at times, not with baby just with FOB. Support is mother and friend. Currently speaking to a counselor every Wednesday.   Denies difficulty breathing or respiratory distress, chest pain, abdominal pain, excessive vaginal bleeding, dysuria, leg pain or swelling  The following portions of the patient's history were reviewed and updated as appropriate: allergies, current medications, past medical history, past surgical history and problem list.  Review of Systems  ROS- Negative except as noted above. Information obtained from patient.   Objective:   BP 99/77   Pulse 100   Ht 5\' 8"  (1.727 m)   Wt 210 lb (95.3 kg)   BMI 31.93 kg/m   General:  alert, cooperative and no distress   Breasts:  deferred, no complaints  Lungs: clear to auscultation bilaterally  Heart:  regular rate and rhythm  Abdomen: soft, nontender   Vulva: normal  Vagina: normal vagina  Cervix:  closed  Corpus: Well-involuted  Adnexa:  Non-palpable  Rectal Exam: No hemorrhoids        Depression screen [facility] 2/9 12/22/2019 11/24/2019 08/08/2019   Decreased Interest 1 0 1  Down, Depressed, Hopeless 1 0 1  PHQ - 2 Score 2 0 2  Altered sleeping 0 0 0  Tired, decreased energy 0 0 3  Change in appetite 1 0 1  Feeling bad or failure about yourself  0 0 3  Trouble concentrating 0 0 1  Moving slowly or fidgety/restless 0 0 0  Suicidal thoughts 0 0 0  PHQ-9 Score 3 0 10  Difficult doing work/chores Not difficult at all - Not difficult at all    Assessment:   Postpartum exam  6 wks s/p spontaneous vaginal delivery of live female infant  Breast/Bottle feeding  Depression screening  Contraception counseling   Plan:  : abstinence and Lactating Mother, has Appointment on 12/29/2019 for Nexplanon  Education provided. See AVS.  Follow up in: 9 months for ANNUAL EXAM with PAP or earlier if needed  Discussed depression screen. Handouts provided. See AVS.  Reviewed Red flags and when to call the office.  02/28/2020 RN [facility] [employer] 12/22/19 2:10 PM

## 2019-12-22 NOTE — Progress Notes (Signed)
I have seen, interviewed, and examined the patient in conjunction with the [employer] Nurse Practitioner student and affirm the diagnosis and management plan.   Gunnar Bulla, CNM [facility], [facility] 12/22/19 6:06 PM

## 2019-12-25 NOTE — Telephone Encounter (Signed)
Can you please print in color for me. Thanks, JML

## 2019-12-27 DIAGNOSIS — F411 Generalized anxiety disorder: Secondary | ICD-10-CM | POA: Diagnosis not present

## 2019-12-27 DIAGNOSIS — F331 Major depressive disorder, recurrent, moderate: Secondary | ICD-10-CM | POA: Diagnosis not present

## 2019-12-29 ENCOUNTER — Ambulatory Visit: Payer: Medicaid Other | Admitting: Certified Nurse Midwife

## 2020-01-03 DIAGNOSIS — F411 Generalized anxiety disorder: Secondary | ICD-10-CM | POA: Diagnosis not present

## 2020-01-03 DIAGNOSIS — F331 Major depressive disorder, recurrent, moderate: Secondary | ICD-10-CM | POA: Diagnosis not present

## 2020-01-04 ENCOUNTER — Ambulatory Visit (INDEPENDENT_AMBULATORY_CARE_PROVIDER_SITE_OTHER): Payer: Medicaid Other | Admitting: Certified Nurse Midwife

## 2020-01-04 ENCOUNTER — Encounter: Payer: Self-pay | Admitting: Certified Nurse Midwife

## 2020-01-04 ENCOUNTER — Other Ambulatory Visit: Payer: Self-pay

## 2020-01-04 VITALS — BP 116/79 | HR 74 | Wt 212.6 lb

## 2020-01-04 DIAGNOSIS — Z975 Presence of (intrauterine) contraceptive device: Secondary | ICD-10-CM | POA: Insufficient documentation

## 2020-01-04 DIAGNOSIS — Z30017 Encounter for initial prescription of implantable subdermal contraceptive: Secondary | ICD-10-CM

## 2020-01-04 NOTE — Progress Notes (Signed)
Gabriela Anderson is a 19 y.o. year old African American female here for Nexplanon insertion.  Patient's last menstrual period was 12/27/2019., last sexual intercourse was approximately two (2) week ago. Patient is unable to urinate; however, agrees to send results of home pregnancy test in two (2) weeks.   Risks/benefits/side effects of Nexplanon have been discussed and her questions have been answered.  Specifically, a failure rate of 07/998 has been reported, with an increased failure rate if pt takes St. John's Wort and/or antiseizure medicaitons.  Gabriela Anderson is aware of the common side effect of irregular bleeding, which the incidence of decreases over time.  BP 116/79   Pulse 74   Wt 212 lb 9.6 oz (96.4 kg)   LMP 12/27/2019   Breastfeeding Yes   BMI 32.33 kg/m   She is right-handed, so her left arm, approximately 4 inches proximal from the elbow, was cleansed with alcohol and anesthetized with 2cc of 2% Lidocaine.  The area was cleansed again with betadine and the Nexplanon was inserted per manufacturer's recommendations without difficulty.  A steri-strip and pressure bandage were applied.  Pt was instructed to keep the area clean and dry, remove pressure bandage in 24 hours, and keep insertion site covered with the steri-strip for 3-5 days.  Back up contraception was recommended for 2 weeks.  She was given a card indicating date Nexplanon was inserted and date it needs to be removed.   Reviewed red flag symptoms and when to call.   RTC as previously scheduled or sooner if needed.    Serafina Royals, CNM [facility], [facility] 01/04/20 11:36 AM    NDC: [id number] Lot: [id number] Exp: 01/2022

## 2020-01-04 NOTE — Patient Instructions (Signed)

## 2020-01-10 DIAGNOSIS — F411 Generalized anxiety disorder: Secondary | ICD-10-CM | POA: Diagnosis not present

## 2020-01-10 DIAGNOSIS — F331 Major depressive disorder, recurrent, moderate: Secondary | ICD-10-CM | POA: Diagnosis not present

## 2020-01-15 ENCOUNTER — Encounter: Payer: Medicaid Other | Admitting: Certified Nurse Midwife

## 2020-01-17 DIAGNOSIS — F411 Generalized anxiety disorder: Secondary | ICD-10-CM | POA: Diagnosis not present

## 2020-01-18 DIAGNOSIS — Z419 Encounter for procedure for purposes other than remedying health state, unspecified: Secondary | ICD-10-CM | POA: Diagnosis not present

## 2020-01-24 DIAGNOSIS — F411 Generalized anxiety disorder: Secondary | ICD-10-CM | POA: Diagnosis not present

## 2020-01-24 DIAGNOSIS — F331 Major depressive disorder, recurrent, moderate: Secondary | ICD-10-CM | POA: Diagnosis not present

## 2020-01-25 ENCOUNTER — Telehealth: Payer: Self-pay | Admitting: Certified Nurse Midwife

## 2020-01-25 NOTE — Telephone Encounter (Signed)
Foye Deer, I sent a message to Tabitha to reach out to the patient. Thanks, JML

## 2020-01-25 NOTE — Telephone Encounter (Signed)
Called patient to get her scheduled for an ultrasound and visit with JML, LVM for her to call the office so we can get her scheduled.

## 2020-01-25 NOTE — Telephone Encounter (Signed)
If we have an ultrasound opening today, then you may schedule her with me to follow. If not, then she will have to wait for next available appointment. Thanks, JML

## 2020-01-31 DIAGNOSIS — F411 Generalized anxiety disorder: Secondary | ICD-10-CM | POA: Diagnosis not present

## 2020-01-31 DIAGNOSIS — F331 Major depressive disorder, recurrent, moderate: Secondary | ICD-10-CM | POA: Diagnosis not present

## 2020-02-07 DIAGNOSIS — F411 Generalized anxiety disorder: Secondary | ICD-10-CM | POA: Diagnosis not present

## 2020-02-07 DIAGNOSIS — F331 Major depressive disorder, recurrent, moderate: Secondary | ICD-10-CM | POA: Diagnosis not present

## 2020-02-08 ENCOUNTER — Other Ambulatory Visit: Payer: Self-pay | Admitting: Certified Nurse Midwife

## 2020-02-08 ENCOUNTER — Other Ambulatory Visit (HOSPITAL_COMMUNITY)
Admission: RE | Admit: 2020-02-08 | Discharge: 2020-02-08 | Disposition: A | Discharge status: Home / Self Care | Payer: Medicaid Other | Source: Ambulatory Visit | Attending: Certified Nurse Midwife | Admitting: Certified Nurse Midwife

## 2020-02-08 ENCOUNTER — Encounter: Payer: Self-pay | Admitting: Certified Nurse Midwife

## 2020-02-08 ENCOUNTER — Ambulatory Visit (INDEPENDENT_AMBULATORY_CARE_PROVIDER_SITE_OTHER): Payer: Medicaid Other | Admitting: Certified Nurse Midwife

## 2020-02-08 ENCOUNTER — Ambulatory Visit (INDEPENDENT_AMBULATORY_CARE_PROVIDER_SITE_OTHER): Payer: Medicaid Other

## 2020-02-08 ENCOUNTER — Other Ambulatory Visit: Payer: Self-pay

## 2020-02-08 VITALS — BP 96/65 | HR 92 | Ht 68.0 in | Wt 213.4 lb

## 2020-02-08 DIAGNOSIS — R52 Pain, unspecified: Secondary | ICD-10-CM

## 2020-02-08 DIAGNOSIS — N898 Other specified noninflammatory disorders of vagina: Secondary | ICD-10-CM | POA: Diagnosis not present

## 2020-02-08 DIAGNOSIS — R102 Pelvic and perineal pain: Secondary | ICD-10-CM | POA: Diagnosis not present

## 2020-02-08 NOTE — Progress Notes (Signed)
GYN ENCOUNTER NOTE  Subjective:       Gabriela Anderson is a 19 y.o. G80P1001 female is here for gynecologic evaluation of the following issues:  1. Pelvic Pain 2. Vaginal odor  Notes intermittent symptoms for the last two (2) weeks, before and after menses.   Denies difficulty breathing or respiratory distress, chest pain, abdominal pain, excessive vaginal bleeding, dysuria, and leg pain or swelling.    Gynecologic History  Patient's last menstrual period was 01/18/2020.  Contraception: Nexplanon  Last Pap: N/A  Obstetric History  OB History  Gravida Para Term Preterm AB Living  1 1 1     1   SAB TAB Ectopic Multiple Live Births        0 1    # Outcome Date GA Lbr Len/2nd Weight Sex Delivery Anes PTL Lv  1 Term 11/08/19 [redacted]w[redacted]d 21:52 / 00:31 7 lb 4.8 oz (3.31 kg) M Vag-Spont   LIV    Past Medical History:  Diagnosis Date  . Anemia   . Anxiety   . Asthma   . Depression     Past Surgical History:  Procedure Laterality Date  . MYRINGOTOMY      Current Outpatient Medications on File Prior to Visit  Medication Sig Dispense Refill  . albuterol (VENTOLIN HFA) 108 (90 Base) MCG/ACT inhaler Inhale 2 puffs into the lungs as needed.    . cetirizine (ZYRTEC) 10 MG tablet Take 10 mg by mouth as needed.    . ferrous sulfate 325 (65 FE) MG tablet TAKE 1 TABLET BY MOUTH TWICE A DAY 60 tablet 1  . Prenatal Vit-Fe Fumarate-FA (MULTIVITAMIN-PRENATAL) 27-0.8 MG TABS tablet Take 1 tablet by mouth daily at 12 noon.     No current facility-administered medications on file prior to visit.    No Known Allergies  Social History   Socioeconomic History  . Marital status: Single    Spouse name: Not on file  . Number of children: Not on file  . Years of education: Not on file  . Highest education level: Not on file  Occupational History  . Not on file  Tobacco Use  . Smoking status: Never Smoker  . Smokeless tobacco: Never Used  Vaping Use  . Vaping Use: Never used  Substance  and Sexual Activity  . Alcohol use: No  . Drug use: No  . Sexual activity: Yes    Comment: planning nexplanon  Other Topics Concern  . Not on file  Social History Narrative  . Not on file   Social Determinants of Health   Financial Resource Strain:   . Difficulty of Paying Living Expenses:   Food Insecurity:   . Worried About [redacted]w[redacted]d in the Last Year:   . Programme researcher, broadcasting/film/video in the Last Year:   Transportation Needs:   . Barista (Medical):   Freight forwarder Lack of Transportation (Non-Medical):   Physical Activity:   . Days of Exercise per Week:   . Minutes of Exercise per Session:   Stress:   . Feeling of Stress :   Social Connections:   . Frequency of Communication with Friends and Family:   . Frequency of Social Gatherings with Friends and Family:   . Attends Religious Services:   . Active Member of Clubs or Organizations:   . Attends [employer] Meetings:   Banker Marital Status:   Intimate Partner Violence:   . Fear of Current or Ex-Partner:   . Emotionally Abused:   .  Physically Abused:   . Sexually Abused:     Family History  Problem Relation Age of Onset  . Heart disease Paternal Grandmother     The following portions of the patient's history were reviewed and updated as appropriate: allergies, current medications, past family history, past medical history, past social history, past surgical history and problem list.  Review of Systems  ROS negative except as noted above. Information obtained from patient.   Objective:   BP 96/65   Pulse 92   Ht 5\' 8"  (1.727 m)   Wt (!) 213 lb 6.4 oz (96.8 kg)   LMP 01/18/2020   Breastfeeding Yes   BMI 32.45 kg/m    CONSTITUTIONAL: Well-developed, well-nourished female in no acute distress.   PELVIC EXAM  VULVA: Normal  VAGINA: Normal, swab collected  ULTRASOUND REPORT  Location: [facility]  Date of Service: 02/08/2020   Indications:Pelvic Pain Findings:  The uterus is anteverted and  measures 8.1 x 4.0 x 4.8 cm. Echo texture is homogenous without evidence of focal masses.  The Endometrium measures 4 mm.  Right Ovary measures 3.1 x 1.8 3.0 cm. It is normal in appearance. Left Ovary measures 33.x 2.1 x 2.4 cm. It is normal in appearance. Survey of the adnexa demonstrates no adnexal masses. There is no free fluid in the cul de sac.  Impression: 1. Transvaginal ultrasound is WNL at this time.  Recommendations: 1.Clinical correlation with the patient's History and Physical Exam.   Assessment:   1. Pelvic pain  - Cervicovaginal ancillary only     Plan:   Vaginal swab collected today, see orders. Will contact patient with results.   Ultrasound findings reviewed with patient, verbalized understanding.   Discussed home treatment measures for management of symptoms.   Reviewed red flag symptoms and when to call.   RTC if symptoms worsen or fail to improve.    02/10/2020, CNM [facility], [facility]

## 2020-02-08 NOTE — Patient Instructions (Signed)
Pelvic Pain, Female Pelvic pain is pain in your lower belly (abdomen), below your belly button and between your hips. The pain may start suddenly (be acute), keep coming back (be recurring), or last a long time (become chronic). Pelvic pain that lasts longer than 6 months is called chronic pelvic pain. There are many causes of pelvic pain. Sometimes the cause of pelvic pain is not known. Follow these instructions at home:   Take over-the-counter and prescription medicines only as told by your doctor.  Rest as told by your doctor.  Do not have sex if it hurts.  Keep a journal of your pelvic pain. Write down: ? When the pain started. ? Where the pain is located. ? What seems to make the pain better or worse, such as food or your period (menstrual cycle). ? Any symptoms you have along with the pain.  Keep all follow-up visits as told by your doctor. This is important. Contact a doctor if:  Medicine does not help your pain.  Your pain comes back.  You have new symptoms.  You have unusual discharge or bleeding from your vagina.  You have a fever or chills.  You are having trouble pooping (constipation).  You have blood in your pee (urine) or poop (stool).  Your pee smells bad.  You feel weak or light-headed. Get help right away if:  You have sudden pain that is very bad.  Your pain keeps getting worse.  You have very bad pain and also have any of these symptoms: ? A fever. ? Feeling sick to your stomach (nausea). ? Throwing up (vomiting). ? Being very sweaty.  You pass out (lose consciousness). Summary  Pelvic pain is pain in your lower belly (abdomen), below your belly button and between your hips.  There are many possible causes of pelvic pain.  Keep a journal of your pelvic pain. This information is not intended to replace advice given to you by your health care provider. Make sure you discuss any questions you have with your health care provider. Document  Revised: 12/22/2017 Document Reviewed: 12/22/2017 Elsevier Patient Education  2020 Elsevier Inc.  

## 2020-02-09 NOTE — Addendum Note (Signed)
Addended by: Shaune Spittle on: 02/09/2020 03:30 PM   Modules accepted: Level of Service

## 2020-02-12 LAB — CERVICOVAGINAL ANCILLARY ONLY
Bacterial Vaginitis (gardnerella): POSITIVE — AB
Candida Glabrata: NEGATIVE
Candida Vaginitis: NEGATIVE
Chlamydia: NEGATIVE
Comment: NEGATIVE
Comment: NEGATIVE
Comment: NEGATIVE
Comment: NEGATIVE
Comment: NEGATIVE
Comment: NORMAL
Neisseria Gonorrhea: NEGATIVE
Trichomonas: NEGATIVE

## 2020-02-14 DIAGNOSIS — F411 Generalized anxiety disorder: Secondary | ICD-10-CM | POA: Diagnosis not present

## 2020-02-14 DIAGNOSIS — F331 Major depressive disorder, recurrent, moderate: Secondary | ICD-10-CM | POA: Diagnosis not present

## 2020-02-15 ENCOUNTER — Other Ambulatory Visit: Payer: Self-pay | Admitting: Certified Nurse Midwife

## 2020-02-15 DIAGNOSIS — B9689 Other specified bacterial agents as the cause of diseases classified elsewhere: Secondary | ICD-10-CM

## 2020-02-15 DIAGNOSIS — N760 Acute vaginitis: Secondary | ICD-10-CM

## 2020-02-15 MED ORDER — METRONIDAZOLE 500 MG PO TABS: 500.0000 mg | ORAL_TABLET | Freq: Two times a day (BID) | ORAL | 0 refills | Status: AC

## 2020-02-15 NOTE — Progress Notes (Signed)
Rx. Flagyl, see orders.   Serafina Royals, CNM [facility], [facility] 02/15/20 2:17 PM

## 2020-02-18 DIAGNOSIS — Z419 Encounter for procedure for purposes other than remedying health state, unspecified: Secondary | ICD-10-CM | POA: Diagnosis not present

## 2020-02-21 DIAGNOSIS — F411 Generalized anxiety disorder: Secondary | ICD-10-CM | POA: Diagnosis not present

## 2020-02-21 DIAGNOSIS — F331 Major depressive disorder, recurrent, moderate: Secondary | ICD-10-CM | POA: Diagnosis not present

## 2020-02-28 DIAGNOSIS — F331 Major depressive disorder, recurrent, moderate: Secondary | ICD-10-CM | POA: Diagnosis not present

## 2020-02-28 DIAGNOSIS — F411 Generalized anxiety disorder: Secondary | ICD-10-CM | POA: Diagnosis not present

## 2020-03-06 DIAGNOSIS — F331 Major depressive disorder, recurrent, moderate: Secondary | ICD-10-CM | POA: Diagnosis not present

## 2020-03-06 DIAGNOSIS — F411 Generalized anxiety disorder: Secondary | ICD-10-CM | POA: Diagnosis not present

## 2020-03-13 DIAGNOSIS — F331 Major depressive disorder, recurrent, moderate: Secondary | ICD-10-CM | POA: Diagnosis not present

## 2020-03-13 DIAGNOSIS — F411 Generalized anxiety disorder: Secondary | ICD-10-CM | POA: Diagnosis not present

## 2020-03-20 DIAGNOSIS — Z419 Encounter for procedure for purposes other than remedying health state, unspecified: Secondary | ICD-10-CM | POA: Diagnosis not present

## 2020-03-20 DIAGNOSIS — F411 Generalized anxiety disorder: Secondary | ICD-10-CM | POA: Diagnosis not present

## 2020-03-20 DIAGNOSIS — F331 Major depressive disorder, recurrent, moderate: Secondary | ICD-10-CM | POA: Diagnosis not present

## 2020-03-27 DIAGNOSIS — F331 Major depressive disorder, recurrent, moderate: Secondary | ICD-10-CM | POA: Diagnosis not present

## 2020-03-27 DIAGNOSIS — F411 Generalized anxiety disorder: Secondary | ICD-10-CM | POA: Diagnosis not present

## 2020-03-29 ENCOUNTER — Emergency Department
Admission: EM | Admit: 2020-03-29 | Discharge: 2020-03-29 | Disposition: A | Discharge status: Home / Self Care | Payer: Medicaid Other | Attending: Student in an Organized Health Care Education/Training Program | Admitting: Student in an Organized Health Care Education/Training Program

## 2020-03-29 ENCOUNTER — Emergency Department: Payer: Medicaid Other

## 2020-03-29 ENCOUNTER — Other Ambulatory Visit: Payer: Self-pay

## 2020-03-29 DIAGNOSIS — J45909 Unspecified asthma, uncomplicated: Secondary | ICD-10-CM | POA: Insufficient documentation

## 2020-03-29 DIAGNOSIS — N939 Abnormal uterine and vaginal bleeding, unspecified: Secondary | ICD-10-CM | POA: Insufficient documentation

## 2020-03-29 DIAGNOSIS — R5381 Other malaise: Secondary | ICD-10-CM | POA: Diagnosis not present

## 2020-03-29 DIAGNOSIS — N854 Malposition of uterus: Secondary | ICD-10-CM | POA: Diagnosis not present

## 2020-03-29 DIAGNOSIS — Z79899 Other long term (current) drug therapy: Secondary | ICD-10-CM | POA: Diagnosis not present

## 2020-03-29 DIAGNOSIS — R0602 Shortness of breath: Secondary | ICD-10-CM | POA: Diagnosis not present

## 2020-03-29 DIAGNOSIS — Z20822 Contact with and (suspected) exposure to covid-19: Secondary | ICD-10-CM | POA: Diagnosis not present

## 2020-03-29 DIAGNOSIS — R109 Unspecified abdominal pain: Secondary | ICD-10-CM | POA: Diagnosis not present

## 2020-03-29 LAB — COMPREHENSIVE METABOLIC PANEL
ALT: 18 U/L (ref 0–44)
AST: 21 U/L (ref 15–41)
Albumin: 4 g/dL (ref 3.5–5.0)
Alkaline Phosphatase: 70 U/L (ref 38–126)
Anion gap: 7 (ref 5–15)
BUN: 18 mg/dL (ref 6–20)
CO2: 23 mmol/L (ref 22–32)
Calcium: 9.1 mg/dL (ref 8.9–10.3)
Chloride: 106 mmol/L (ref 98–111)
Creatinine, Ser: 0.84 mg/dL (ref 0.44–1.00)
GFR calc Af Amer: >60 mL/min (ref >60)
GFR calc non Af Amer: >60 mL/min (ref >60)
Glucose, Bld: 96 mg/dL (ref 70–99)
Potassium: 3.8 mmol/L (ref 3.5–5.1)
Sodium: 136 mmol/L (ref 135–145)
Total Bilirubin: 0.8 mg/dL (ref 0.3–1.2)
Total Protein: 8.1 g/dL (ref 6.5–8.1)

## 2020-03-29 LAB — POCT PREGNANCY, URINE: Preg Test, Ur: NEGATIVE

## 2020-03-29 LAB — URINALYSIS, COMPLETE (UACMP) WITH MICROSCOPIC
Bilirubin Urine: NEGATIVE
Glucose, UA: NEGATIVE mg/dL
Ketones, ur: NEGATIVE mg/dL
Nitrite: NEGATIVE
Protein, ur: NEGATIVE mg/dL
Specific Gravity, Urine: 1.027 (ref 1.005–1.030)
pH: 5 (ref 5.0–8.0)

## 2020-03-29 LAB — CBC
HCT: 34.8 % — ABNORMAL LOW (ref 36.0–46.0)
Hemoglobin: 12.2 g/dL (ref 12.0–15.0)
MCH: 27.4 pg (ref 26.0–34.0)
MCHC: 35.1 g/dL (ref 30.0–36.0)
MCV: 78.2 fL — ABNORMAL LOW (ref 80.0–100.0)
Platelets: 313 10*3/uL (ref 150–400)
RBC: 4.45 MIL/uL (ref 3.87–5.11)
RDW: 13.6 % (ref 11.5–15.5)
WBC: 6.4 10*3/uL (ref 4.0–10.5)
nRBC: 0 % (ref 0.0–0.2)

## 2020-03-29 LAB — SARS CORONAVIRUS 2 BY RT PCR (HOSPITAL ORDER, PERFORMED IN ~~LOC~~ HOSPITAL LAB): SARS Coronavirus 2: NEGATIVE

## 2020-03-29 LAB — LIPASE, BLOOD: Lipase: 28 U/L (ref 11–51)

## 2020-03-29 LAB — TROPONIN I (HIGH SENSITIVITY): Troponin I (High Sensitivity): <2 ng/L (ref <18)

## 2020-03-29 NOTE — ED Provider Notes (Signed)
[facility] Emergency Department Provider Note    First MD Initiated Contact with Patient 03/29/20 2103     (approximate)  I have reviewed the triage vital signs and the nursing notes.   HISTORY  Chief Complaint Shortness of Breath and Abdominal Pain    HPI Gabriela Anderson is a 19 y.o. female   presents to the ER for generalized malaise and nausea vaginal bleeding for the past several months status post uncomplicated delivery 5 months ago recently starting birth control.  Also having some achiness and shortness of breath after receiving her first Covid vaccination.  Patient states that she also think she might be getting overheated at work.  Denies any active chest pain right now.  Has a history of asthma.  Does not feel she is wheezing.  Denies any abdominal pain.   Past Medical History:  Diagnosis Date  . Anemia   . Anxiety   . Asthma   . Depression    Family History  Problem Relation Age of Onset  . Heart disease Paternal Grandmother    Past Surgical History:  Procedure Laterality Date  . MYRINGOTOMY    . NO PAST SURGERIES     Patient Active Problem List   Diagnosis Date Noted  . Nexplanon in place 01/04/2020  . Alpha thalassemia silent carrier 11/07/2019      Prior to Admission medications   Medication Sig Start Date End Date Taking? Authorizing Provider  albuterol (VENTOLIN HFA) 108 (90 Base) MCG/ACT inhaler Inhale 2 puffs into the lungs as needed.    [provider]  cetirizine (ZYRTEC) 10 MG tablet Take 10 mg by mouth as needed.    [provider]  ferrous sulfate 325 (65 FE) MG tablet TAKE 1 TABLET BY MOUTH TWICE A DAY 11/11/19   Hildred Laser, MD  Prenatal Vit-Fe Fumarate-FA (MULTIVITAMIN-PRENATAL) 27-0.8 MG TABS tablet Take 1 tablet by mouth daily at 12 noon.    [provider]    Allergies Patient has no known allergies.    Social History Social History   Tobacco Use  . Smoking status: Never  Smoker  . Smokeless tobacco: Never Used  Vaping Use  . Vaping Use: Never used  Substance Use Topics  . Alcohol use: No  . Drug use: No    Review of Systems Patient denies headaches, rhinorrhea, blurry vision, numbness, shortness of breath, chest pain, edema, cough, abdominal pain, nausea, vomiting, diarrhea, dysuria, fevers, rashes or hallucinations unless otherwise stated above in HPI. ____________________________________________   PHYSICAL EXAM:  VITAL SIGNS: Vitals:   03/29/20 2225 03/29/20 2226  BP:    Pulse: 85 85  Resp:    Temp:    SpO2: 100% 100%    Constitutional: Alert and oriented.  Eyes: Conjunctivae are normal.  Head: Atraumatic. Nose: No congestion/rhinnorhea. Mouth/Throat: Mucous membranes are moist.   Neck: No stridor. Painless ROM.  Cardiovascular: Normal rate, regular rhythm. Grossly normal heart sounds.  Good peripheral circulation. Respiratory: Normal respiratory effort.  No retractions. Lungs CTAB. Gastrointestinal: Soft and nontender. No distention. No abdominal bruits. No CVA tenderness. Genitourinary:  Musculoskeletal: No lower extremity tenderness nor edema.  No joint effusions. Neurologic:  Normal speech and language. No gross focal neurologic deficits are appreciated. No facial droop Skin:  Skin is warm, dry and intact. No rash noted. Psychiatric: Mood and affect are normal. Speech and behavior are normal.  ____________________________________________   LABS (all labs ordered are listed, but only abnormal results are displayed)  Results  for orders placed or performed during the hospital encounter of 03/29/20 (from the past 24 hour(s))  Lipase, blood     Status: None   Collection Time: 03/29/20  6:16 PM  Result Value Ref Range   Lipase 28 11 - 51 U/L  Comprehensive metabolic panel     Status: None   Collection Time: 03/29/20  6:16 PM  Result Value Ref Range   Sodium 136 135 - 145 mmol/L   Potassium 3.8 3.5 - 5.1 mmol/L   Chloride 106 98  - 111 mmol/L   CO2 23 22 - 32 mmol/L   Glucose, Bld 96 70 - 99 mg/dL   BUN 18 6 - 20 mg/dL   Creatinine, Ser 9.93 0.44 - 1.00 mg/dL   Calcium 9.1 8.9 - 71.6 mg/dL   Total Protein 8.1 6.5 - 8.1 g/dL   Albumin 4.0 3.5 - 5.0 g/dL   AST 21 15 - 41 U/L   ALT 18 0 - 44 U/L   Alkaline Phosphatase 70 38 - 126 U/L   Total Bilirubin 0.8 0.3 - 1.2 mg/dL   GFR calc non Af Amer >60 >60 mL/min   GFR calc Af Amer >60 >60 mL/min   Anion gap 7 5 - 15  CBC     Status: Abnormal   Collection Time: 03/29/20  6:16 PM  Result Value Ref Range   WBC 6.4 4.0 - 10.5 K/uL   RBC 4.45 3.87 - 5.11 MIL/uL   Hemoglobin 12.2 12.0 - 15.0 g/dL   HCT 96.7 (L) 36 - 46 %   MCV 78.2 (L) 80.0 - 100.0 fL   MCH 27.4 26.0 - 34.0 pg   MCHC 35.1 30.0 - 36.0 g/dL   RDW 89.3 81.0 - 17.5 %   Platelets 313 150 - 400 K/uL   nRBC 0.0 0.0 - 0.2 %  Urinalysis, Complete w Microscopic     Status: Abnormal   Collection Time: 03/29/20  6:16 PM  Result Value Ref Range   Color, Urine YELLOW (A) YELLOW   APPearance CLOUDY (A) CLEAR   Specific Gravity, Urine 1.027 1.005 - 1.030   pH 5.0 5.0 - 8.0   Glucose, UA NEGATIVE NEGATIVE mg/dL   Hgb urine dipstick MODERATE (A) NEGATIVE   Bilirubin Urine NEGATIVE NEGATIVE   Ketones, ur NEGATIVE NEGATIVE mg/dL   Protein, ur NEGATIVE NEGATIVE mg/dL   Nitrite NEGATIVE NEGATIVE   Leukocytes,Ua TRACE (A) NEGATIVE   RBC / HPF 0-5 0 - 5 RBC/hpf   WBC, UA 0-5 0 - 5 WBC/hpf   Bacteria, UA RARE (A) NONE SEEN   Squamous Epithelial / LPF 21-50 0 - 5   Mucus PRESENT   Troponin I (High Sensitivity)     Status: None   Collection Time: 03/29/20  6:16 PM  Result Value Ref Range   Troponin I (High Sensitivity) <2 <18 ng/L  Pregnancy, urine POC     Status: None   Collection Time: 03/29/20  6:20 PM  Result Value Ref Range   Preg Test, Ur NEGATIVE NEGATIVE   ____________________________________________  EKG My review and personal interpretation at Time: 18:07   Indication: malaise  Rate: 95  Rhythm:  sinus Axis: rightward Other: normal intervals, no stemi ____________________________________________  RADIOLOGY  I personally reviewed all radiographic images ordered to evaluate for the above acute complaints and reviewed radiology reports and findings.  These findings were personally discussed with the patient.  Please see medical record for radiology report.  ____________________________________________   PROCEDURES  Procedure(s)  performed:  Procedures    Critical Care performed: no ____________________________________________   INITIAL IMPRESSION / ASSESSMENT AND PLAN / ED COURSE  Pertinent labs & imaging results that were available during my care of the patient were reviewed by me and considered in my medical decision making (see chart for details).   DDX: Viral illness, pneumonia, electrolyte abnormality, anemia, dysrhythmia, PE, CHF, myocarditis, Covid  ELOUISE DIVELBISS is a 19 y.o. who presents to the ED with symptoms as described above.  Patient exceedingly well-appearing afebrile not tachycardic no hypoxia.  Does not seem clinically consistent with PE or CHF.  Blood work is reassuring.  No signs of infiltrates.  Not consistent with sepsis.  She not pregnant.  Ultrasound is reassuring.  Patient stable and appropriate for outpatient follow-up     The patient was evaluated in Emergency Department today for the symptoms described in the history of present illness. He/she was evaluated in the context of the global COVID-19 pandemic, which necessitated consideration that the patient might be at risk for infection with the SARS-CoV-2 virus that causes COVID-19. Institutional protocols and algorithms that pertain to the evaluation of patients at risk for COVID-19 are in a state of rapid change based on information released by regulatory bodies including the CDC and federal and state organizations. These policies and algorithms were followed during the patient's care in the ED.  As  part of my medical decision making, I reviewed the following data within the electronic MEDICAL RECORD NUMBER Nursing notes reviewed and incorporated, Labs reviewed, notes from prior ED visits and [place] Controlled Substance Database   ____________________________________________   FINAL CLINICAL IMPRESSION(S) / ED DIAGNOSES  Final diagnoses:  Vaginal bleeding  Malaise      NEW MEDICATIONS STARTED DURING THIS VISIT:  New Prescriptions   No medications on file     Note:  This document was prepared using Dragon voice recognition software and may include unintentional dictation errors.    Willy Eddy, MD 03/29/20 2239

## 2020-03-29 NOTE — ED Triage Notes (Addendum)
Pt comes via POV from home with c/o SOB and abdominal pain. Pt states she just got her 1st shot on Saturday.  Pt states this started few days ago. Pt states she has also been bleeding for the last 3 months. Pt states she didn't know if it was from irregular period after having her son. pt states she does breastfeed on and off. Pt states pain in left side of leg and shoots up to her neck recently as well.

## 2020-04-03 DIAGNOSIS — F411 Generalized anxiety disorder: Secondary | ICD-10-CM | POA: Diagnosis not present

## 2020-04-03 DIAGNOSIS — F331 Major depressive disorder, recurrent, moderate: Secondary | ICD-10-CM | POA: Diagnosis not present

## 2020-04-10 DIAGNOSIS — F331 Major depressive disorder, recurrent, moderate: Secondary | ICD-10-CM | POA: Diagnosis not present

## 2020-04-10 DIAGNOSIS — F411 Generalized anxiety disorder: Secondary | ICD-10-CM | POA: Diagnosis not present

## 2020-04-17 DIAGNOSIS — F411 Generalized anxiety disorder: Secondary | ICD-10-CM | POA: Diagnosis not present

## 2020-04-17 DIAGNOSIS — F331 Major depressive disorder, recurrent, moderate: Secondary | ICD-10-CM | POA: Diagnosis not present

## 2020-04-19 DIAGNOSIS — Z419 Encounter for procedure for purposes other than remedying health state, unspecified: Secondary | ICD-10-CM | POA: Diagnosis not present

## 2020-04-24 DIAGNOSIS — F411 Generalized anxiety disorder: Secondary | ICD-10-CM | POA: Diagnosis not present

## 2020-04-24 DIAGNOSIS — F331 Major depressive disorder, recurrent, moderate: Secondary | ICD-10-CM | POA: Diagnosis not present

## 2020-04-26 ENCOUNTER — Ambulatory Visit (INDEPENDENT_AMBULATORY_CARE_PROVIDER_SITE_OTHER): Payer: Medicaid Other | Admitting: Certified Nurse Midwife

## 2020-04-26 ENCOUNTER — Encounter: Payer: Self-pay | Admitting: Certified Nurse Midwife

## 2020-04-26 ENCOUNTER — Other Ambulatory Visit (HOSPITAL_COMMUNITY)
Admission: RE | Admit: 2020-04-26 | Discharge: 2020-04-26 | Disposition: A | Discharge status: Home / Self Care | Payer: Medicaid Other | Source: Ambulatory Visit | Attending: Certified Nurse Midwife | Admitting: Certified Nurse Midwife

## 2020-04-26 ENCOUNTER — Other Ambulatory Visit: Payer: Self-pay

## 2020-04-26 VITALS — BP 121/91 | HR 89 | Ht 68.0 in | Wt 232.7 lb

## 2020-04-26 DIAGNOSIS — Z975 Presence of (intrauterine) contraceptive device: Secondary | ICD-10-CM

## 2020-04-26 DIAGNOSIS — N921 Excessive and frequent menstruation with irregular cycle: Secondary | ICD-10-CM | POA: Insufficient documentation

## 2020-04-26 DIAGNOSIS — R102 Pelvic and perineal pain: Secondary | ICD-10-CM

## 2020-04-26 NOTE — Progress Notes (Signed)
GYN ENCOUNTER NOTE  Subjective:       Gabriela Anderson is a 19 y.o. G87P1001 female is here for gynecologic evaluation of the following issues:  1. Intermittent left sided abdominal pain 2. Breakthrough bleeding with Nexplanon 3. Requests STI testing  Patient seen in ER on 03/29/2020 for evaluation of similar concerns with negative workup; for further details, please see note.   Denies difficulty breathing or respiratory distress, chest pain, abdominal pain, excessive vaginal bleeding, dysuria, and leg pain or swelling.    Gynecologic History  Patient's last menstrual period was 03/29/2020 (approximate). Period Duration (Days): vary Period Pattern: (!) Irregular Menstrual Flow: Heavy, Moderate Menstrual Control: Maxi pad, Thin pad Menstrual Control Change Freq (Hours): 3 Dysmenorrhea: None  Contraception: Nexplanon  Last Pap: N/A  Obstetric History  OB History  Gravida Para Term Preterm AB Living  1 1 1     1   SAB TAB Ectopic Multiple Live Births        0 1    # Outcome Date GA Lbr Len/2nd Weight Sex Delivery Anes PTL Lv  1 Term 11/08/19 [redacted]w[redacted]d 21:52 / 00:31 7 lb 4.8 oz (3.31 kg) M Vag-Spont   LIV    Past Medical History:  Diagnosis Date  . Anemia   . Anxiety   . Asthma   . Depression     Past Surgical History:  Procedure Laterality Date  . MYRINGOTOMY    . NO PAST SURGERIES      Current Outpatient Medications on File Prior to Visit  Medication Sig Dispense Refill  . albuterol (VENTOLIN HFA) 108 (90 Base) MCG/ACT inhaler Inhale 2 puffs into the lungs as needed.    . cetirizine (ZYRTEC) 10 MG tablet Take 10 mg by mouth as needed.    . Multiple Vitamin (MULTIVITAMIN ADULT PO) Take by mouth.    . Prenatal Vit-Fe Fumarate-FA (MULTIVITAMIN-PRENATAL) 27-0.8 MG TABS tablet Take 1 tablet by mouth daily at 12 noon.    . ferrous sulfate 325 (65 FE) MG tablet TAKE 1 TABLET BY MOUTH TWICE A DAY (Patient not taking: Reported on 04/26/2020) 60 tablet 1   No current  facility-administered medications on file prior to visit.    No Known Allergies  Social History   Socioeconomic History  . Marital status: Single    Spouse name: Not on file  . Number of children: Not on file  . Years of education: Not on file  . Highest education level: Not on file  Occupational History  . Not on file  Tobacco Use  . Smoking status: Never Smoker  . Smokeless tobacco: Never Used  Vaping Use  . Vaping Use: Never used  Substance and Sexual Activity  . Alcohol use: No  . Drug use: No  . Sexual activity: Yes    Birth control/protection: Implant  Other Topics Concern  . Not on file  Social History Narrative  . Not on file   Social Determinants of Health   Financial Resource Strain:   . Difficulty of Paying Living Expenses: Not on file  Food Insecurity:   . Worried About 06/26/2020 in the Last Year: Not on file  . Ran Out of Food in the Last Year: Not on file  Transportation Needs:   . Lack of Transportation (Medical): Not on file  . Lack of Transportation (Non-Medical): Not on file  Physical Activity:   . Days of Exercise per Week: Not on file  . Minutes of Exercise per Session: Not on  file  Stress:   . Feeling of Stress : Not on file  Social Connections:   . Frequency of Communication with Friends and Family: Not on file  . Frequency of Social Gatherings with Friends and Family: Not on file  . Attends Religious Services: Not on file  . Active Member of Clubs or Organizations: Not on file  . Attends Banker Meetings: Not on file  . Marital Status: Not on file  Intimate Partner Violence:   . Fear of Current or Ex-Partner: Not on file  . Emotionally Abused: Not on file  . Physically Abused: Not on file  . Sexually Abused: Not on file    Family History  Problem Relation Age of Onset  . Heart disease Paternal Grandmother     The following portions of the patient's history were reviewed and updated as appropriate: allergies,  current medications, past family history, past medical history, past social history, past surgical history and problem list.  Review of Systems  ROS negative except as noted above. Information obtained from patient.   Objective:   BP (!) 121/91   Pulse 89   Ht 5\' 8"  (1.727 m)   Wt 232 lb 11.2 oz (105.6 kg)   LMP 03/29/2020 (Approximate)   Breastfeeding Yes   BMI 35.38 kg/m   CONSTITUTIONAL: Well-developed, well-nourished female in no acute distress.   ABDOMEN: Soft, non distended; Non tender.  No Organomegaly.  PELVIC:  External Genitalia: Normal  Vagina: Normal  Cervix: Normal, swab collected  Uterus: Normal size, shape,consistency, mobile  Adnexa: Normal   MUSCULOSKELETAL: Normal range of motion. No tenderness.  No cyanosis, clubbing, or edema.   Assessment:   1. Breakthrough bleeding on Nexplanon  - Cervicovaginal ancillary only  2. Pelvic pain  - Cervicovaginal ancillary only - Ambulatory referral to Physical Therapy     Plan:   Discussed home vaginal health techniques. Possible postpartum prolapse as reason for pelvic and vaginal pressure.  Vaginal swab collected, will contact patient with results.   Referral to physical therapy, see orders.   Reviewed red flag symptoms and when to call.   RTC if symptoms worsen or fail to improve.    05/29/2020, CNM [facility], [facility]

## 2020-04-26 NOTE — Patient Instructions (Signed)
Pelvic Pain, Female Pelvic pain is pain in your lower belly (abdomen), below your belly button and between your hips. The pain may start suddenly (be acute), keep coming back (be recurring), or last a long time (become chronic). Pelvic pain that lasts longer than 6 months is called chronic pelvic pain. There are many causes of pelvic pain. Sometimes the cause of pelvic pain is not known. Follow these instructions at home:   Take over-the-counter and prescription medicines only as told by your doctor.  Rest as told by your doctor.  Do not have sex if it hurts.  Keep a journal of your pelvic pain. Write down: ? When the pain started. ? Where the pain is located. ? What seems to make the pain better or worse, such as food or your period (menstrual cycle). ? Any symptoms you have along with the pain.  Keep all follow-up visits as told by your doctor. This is important. Contact a doctor if:  Medicine does not help your pain.  Your pain comes back.  You have new symptoms.  You have unusual discharge or bleeding from your vagina.  You have a fever or chills.  You are having trouble pooping (constipation).  You have blood in your pee (urine) or poop (stool).  Your pee smells bad.  You feel weak or light-headed. Get help right away if:  You have sudden pain that is very bad.  Your pain keeps getting worse.  You have very bad pain and also have any of these symptoms: ? A fever. ? Feeling sick to your stomach (nausea). ? Throwing up (vomiting). ? Being very sweaty.  You pass out (lose consciousness). Summary  Pelvic pain is pain in your lower belly (abdomen), below your belly button and between your hips.  There are many possible causes of pelvic pain.  Keep a journal of your pelvic pain. This information is not intended to replace advice given to you by your health care provider. Make sure you discuss any questions you have with your health care provider. Document  Revised: 12/22/2017 Document Reviewed: 12/22/2017 Elsevier Patient Education  2020 Elsevier Inc.  

## 2020-04-29 DIAGNOSIS — Z20822 Contact with and (suspected) exposure to covid-19: Secondary | ICD-10-CM | POA: Diagnosis not present

## 2020-04-29 DIAGNOSIS — Z03818 Encounter for observation for suspected exposure to other biological agents ruled out: Secondary | ICD-10-CM | POA: Diagnosis not present

## 2020-04-30 ENCOUNTER — Other Ambulatory Visit: Payer: Self-pay

## 2020-04-30 ENCOUNTER — Telehealth: Payer: Self-pay

## 2020-04-30 LAB — CERVICOVAGINAL ANCILLARY ONLY
Bacterial Vaginitis (gardnerella): POSITIVE — AB
Candida Glabrata: NEGATIVE
Candida Vaginitis: NEGATIVE
Chlamydia: NEGATIVE
Comment: NEGATIVE
Comment: NEGATIVE
Comment: NEGATIVE
Comment: NEGATIVE
Comment: NEGATIVE
Comment: NORMAL
Neisseria Gonorrhea: POSITIVE — AB
Trichomonas: NEGATIVE

## 2020-04-30 MED ORDER — METRONIDAZOLE 500 MG PO TABS: 500.0000 mg | ORAL_TABLET | Freq: Two times a day (BID) | ORAL | 0 refills | Status: DC

## 2020-04-30 NOTE — Telephone Encounter (Signed)
Called pt in regards to message- Pt aware of results.  Informed pt positive for gonorrhea and BV. Flagyl sent to pharmacy on file for BV. Per Dario Guardian, CNM pt to have Rocephin 500 mg IM for gonorrhea treatment.  Pt scheduled 05/01/20. Pt aware. Informed pt partner will need to be tested and treated as well. Pt stated she no longer talks to him however, will send him a text.  Pt verbalized understanding.

## 2020-05-01 ENCOUNTER — Other Ambulatory Visit: Payer: Self-pay

## 2020-05-01 ENCOUNTER — Ambulatory Visit (INDEPENDENT_AMBULATORY_CARE_PROVIDER_SITE_OTHER): Payer: Medicaid Other | Admitting: Certified Nurse Midwife

## 2020-05-01 VITALS — BP 106/65 | HR 77

## 2020-05-01 DIAGNOSIS — A549 Gonococcal infection, unspecified: Secondary | ICD-10-CM

## 2020-05-01 DIAGNOSIS — F331 Major depressive disorder, recurrent, moderate: Secondary | ICD-10-CM | POA: Diagnosis not present

## 2020-05-01 DIAGNOSIS — F411 Generalized anxiety disorder: Secondary | ICD-10-CM | POA: Diagnosis not present

## 2020-05-01 MED ORDER — CEFTRIAXONE SODIUM 500 MG IJ SOLR
500.0000 mg | Freq: Once | INTRAMUSCULAR | Status: AC
  Administered 2020-05-01: 500 mg via INTRAMUSCULAR

## 2020-05-01 NOTE — Patient Instructions (Signed)

## 2020-05-01 NOTE — Progress Notes (Signed)
I have reviewed the record and concur with patient management and plan of care.    Serafina Royals, CNM [facility], [facility] 05/01/20 2:43 PM

## 2020-05-01 NOTE — Progress Notes (Signed)
Pt present for Rocephin IM inj. Pt tolerated well. No adverse reactions.

## 2020-05-08 DIAGNOSIS — F331 Major depressive disorder, recurrent, moderate: Secondary | ICD-10-CM | POA: Diagnosis not present

## 2020-05-08 DIAGNOSIS — F411 Generalized anxiety disorder: Secondary | ICD-10-CM | POA: Diagnosis not present

## 2020-05-10 DIAGNOSIS — Z03818 Encounter for observation for suspected exposure to other biological agents ruled out: Secondary | ICD-10-CM | POA: Diagnosis not present

## 2020-05-10 DIAGNOSIS — Z20822 Contact with and (suspected) exposure to covid-19: Secondary | ICD-10-CM | POA: Diagnosis not present

## 2020-05-15 DIAGNOSIS — Z03818 Encounter for observation for suspected exposure to other biological agents ruled out: Secondary | ICD-10-CM | POA: Diagnosis not present

## 2020-05-15 DIAGNOSIS — F411 Generalized anxiety disorder: Secondary | ICD-10-CM | POA: Diagnosis not present

## 2020-05-15 DIAGNOSIS — Z20822 Contact with and (suspected) exposure to covid-19: Secondary | ICD-10-CM | POA: Diagnosis not present

## 2020-05-15 DIAGNOSIS — F331 Major depressive disorder, recurrent, moderate: Secondary | ICD-10-CM | POA: Diagnosis not present

## 2020-05-20 DIAGNOSIS — Z419 Encounter for procedure for purposes other than remedying health state, unspecified: Secondary | ICD-10-CM | POA: Diagnosis not present

## 2020-05-22 DIAGNOSIS — F411 Generalized anxiety disorder: Secondary | ICD-10-CM | POA: Diagnosis not present

## 2020-05-22 DIAGNOSIS — F331 Major depressive disorder, recurrent, moderate: Secondary | ICD-10-CM | POA: Diagnosis not present

## 2020-05-29 DIAGNOSIS — F331 Major depressive disorder, recurrent, moderate: Secondary | ICD-10-CM | POA: Diagnosis not present

## 2020-05-29 DIAGNOSIS — F411 Generalized anxiety disorder: Secondary | ICD-10-CM | POA: Diagnosis not present

## 2020-06-05 DIAGNOSIS — F331 Major depressive disorder, recurrent, moderate: Secondary | ICD-10-CM | POA: Diagnosis not present

## 2020-06-05 DIAGNOSIS — F411 Generalized anxiety disorder: Secondary | ICD-10-CM | POA: Diagnosis not present

## 2020-06-07 ENCOUNTER — Encounter: Payer: Self-pay | Admitting: Certified Nurse Midwife

## 2020-06-07 ENCOUNTER — Ambulatory Visit (INDEPENDENT_AMBULATORY_CARE_PROVIDER_SITE_OTHER): Payer: Medicaid Other | Admitting: Certified Nurse Midwife

## 2020-06-07 ENCOUNTER — Other Ambulatory Visit (HOSPITAL_COMMUNITY)
Admission: RE | Admit: 2020-06-07 | Discharge: 2020-06-07 | Disposition: A | Discharge status: Home / Self Care | Payer: Medicaid Other | Source: Ambulatory Visit | Attending: Certified Nurse Midwife | Admitting: Certified Nurse Midwife

## 2020-06-07 ENCOUNTER — Other Ambulatory Visit: Payer: Self-pay

## 2020-06-07 VITALS — BP 122/72 | HR 93 | Ht 68.0 in | Wt 233.5 lb

## 2020-06-07 DIAGNOSIS — Z8619 Personal history of other infectious and parasitic diseases: Secondary | ICD-10-CM | POA: Diagnosis not present

## 2020-06-07 DIAGNOSIS — Z8742 Personal history of other diseases of the female genital tract: Secondary | ICD-10-CM | POA: Insufficient documentation

## 2020-06-07 NOTE — Progress Notes (Signed)
GYN ENCOUNTER NOTE  Subjective:       Gabriela Anderson is a 19 y.o. G65P1001 female here for test of cure one (1) month after completing treatment for vaginitis.   Denies difficulty breathing or respiratory distress, chest pain, abdominal pain, excessive vaginal bleeding, dysuria, and leg pain or swelling.    Gynecologic History  Patient's last menstrual period was 05/22/2020.  Contraception: Nexplanon  Last Pap: N/A  Obstetric History  OB History  Gravida Para Term Preterm AB Living  1 1 1     1   SAB TAB Ectopic Multiple Live Births        0 1    # Outcome Date GA Lbr Len/2nd Weight Sex Delivery Anes PTL Lv  1 Term 11/08/19 [redacted]w[redacted]d 21:52 / 00:31 7 lb 4.8 oz (3.31 kg) M Vag-Spont   LIV    Past Medical History:  Diagnosis Date  . Anemia   . Anxiety   . Asthma   . Depression     Past Surgical History:  Procedure Laterality Date  . MYRINGOTOMY    . NO PAST SURGERIES      Current Outpatient Medications on File Prior to Visit  Medication Sig Dispense Refill  . albuterol (VENTOLIN HFA) 108 (90 Base) MCG/ACT inhaler Inhale 2 puffs into the lungs as needed.    . cetirizine (ZYRTEC) 10 MG tablet Take 10 mg by mouth as needed.    . Multiple Vitamin (MULTIVITAMIN ADULT PO) Take by mouth.    . Prenatal Vit-Fe Fumarate-FA (MULTIVITAMIN-PRENATAL) 27-0.8 MG TABS tablet Take 1 tablet by mouth daily at 12 noon.    . metroNIDAZOLE (FLAGYL) 500 MG tablet Take 1 tablet (500 mg total) by mouth 2 (two) times daily. 14 tablet 0   No current facility-administered medications on file prior to visit.    No Known Allergies  Social History   Socioeconomic History  . Marital status: Single    Spouse name: Not on file  . Number of children: Not on file  . Years of education: Not on file  . Highest education level: Not on file  Occupational History  . Not on file  Tobacco Use  . Smoking status: Never Smoker  . Smokeless tobacco: Never Used  Vaping Use  . Vaping Use: Never used   Substance and Sexual Activity  . Alcohol use: No  . Drug use: No  . Sexual activity: Not Currently    Birth control/protection: Implant  Other Topics Concern  . Not on file  Social History Narrative  . Not on file   Social Determinants of Health   Financial Resource Strain:   . Difficulty of Paying Living Expenses: Not on file  Food Insecurity:   . Worried About [redacted]w[redacted]d in the Last Year: Not on file  . Ran Out of Food in the Last Year: Not on file  Transportation Needs:   . Lack of Transportation (Medical): Not on file  . Lack of Transportation (Non-Medical): Not on file  Physical Activity:   . Days of Exercise per Week: Not on file  . Minutes of Exercise per Session: Not on file  Stress:   . Feeling of Stress : Not on file  Social Connections:   . Frequency of Communication with Friends and Family: Not on file  . Frequency of Social Gatherings with Friends and Family: Not on file  . Attends Religious Services: Not on file  . Active Member of Clubs or Organizations: Not on file  .  Attends Banker Meetings: Not on file  . Marital Status: Not on file  Intimate Partner Violence:   . Fear of Current or Ex-Partner: Not on file  . Emotionally Abused: Not on file  . Physically Abused: Not on file  . Sexually Abused: Not on file    Family History  Problem Relation Age of Onset  . Heart disease Paternal Grandmother     The following portions of the patient's history were reviewed and updated as appropriate: allergies, current medications, past family history, past medical history, past social history, past surgical history and problem list.  Review of Systems  ROS negative except as noted above. Information obtained from patient.   Objective:   BP 122/72   Pulse 93   Ht 5\' 8"  (1.727 m)   Wt 233 lb 8 oz (105.9 kg)   LMP 05/22/2020   BMI 35.50 kg/m    CONSTITUTIONAL: Well-developed, well-nourished female in no acute distress.    PELVIC:  External Genitalia: Normal  Vagina: Normal, swab collected   MUSCULOSKELETAL: Normal range of motion. No tenderness.  No cyanosis, clubbing, or edema.   Assessment:   1. History of vaginitis  - Cervicovaginal ancillary only  2. History of gonorrhea  - Cervicovaginal ancillary only   Plan:   Vaginal swab collected, see orders. Will contact patient with results.   Encouraged routine health maintenance techniques.   Reviewed red flag symptoms and when to call.   RTC as previously scheduled or sooner if needed.    13/09/2019, CNM [facility], [facility] 06/07/20 12:58 PM

## 2020-06-07 NOTE — Patient Instructions (Signed)

## 2020-06-10 LAB — CERVICOVAGINAL ANCILLARY ONLY
Bacterial Vaginitis (gardnerella): NEGATIVE
Candida Glabrata: NEGATIVE
Candida Vaginitis: NEGATIVE
Chlamydia: NEGATIVE
Comment: NEGATIVE
Comment: NEGATIVE
Comment: NEGATIVE
Comment: NEGATIVE
Comment: NEGATIVE
Comment: NORMAL
Neisseria Gonorrhea: NEGATIVE
Trichomonas: NEGATIVE

## 2020-06-12 DIAGNOSIS — F411 Generalized anxiety disorder: Secondary | ICD-10-CM | POA: Diagnosis not present

## 2020-06-12 DIAGNOSIS — F331 Major depressive disorder, recurrent, moderate: Secondary | ICD-10-CM | POA: Diagnosis not present

## 2020-06-19 DIAGNOSIS — F331 Major depressive disorder, recurrent, moderate: Secondary | ICD-10-CM | POA: Diagnosis not present

## 2020-06-19 DIAGNOSIS — Z419 Encounter for procedure for purposes other than remedying health state, unspecified: Secondary | ICD-10-CM | POA: Diagnosis not present

## 2020-06-19 DIAGNOSIS — F411 Generalized anxiety disorder: Secondary | ICD-10-CM | POA: Diagnosis not present

## 2020-06-26 DIAGNOSIS — F331 Major depressive disorder, recurrent, moderate: Secondary | ICD-10-CM | POA: Diagnosis not present

## 2020-06-26 DIAGNOSIS — F411 Generalized anxiety disorder: Secondary | ICD-10-CM | POA: Diagnosis not present

## 2020-07-05 ENCOUNTER — Other Ambulatory Visit: Payer: Self-pay

## 2020-07-05 ENCOUNTER — Encounter: Payer: Self-pay | Admitting: Emergency Medicine

## 2020-07-05 ENCOUNTER — Emergency Department
Admission: EM | Admit: 2020-07-05 | Discharge: 2020-07-05 | Disposition: A | Discharge status: Home / Self Care | Payer: Medicaid Other | Attending: Emergency Medicine | Admitting: Emergency Medicine

## 2020-07-05 DIAGNOSIS — J039 Acute tonsillitis, unspecified: Secondary | ICD-10-CM | POA: Diagnosis not present

## 2020-07-05 DIAGNOSIS — R509 Fever, unspecified: Secondary | ICD-10-CM

## 2020-07-05 DIAGNOSIS — Z20822 Contact with and (suspected) exposure to covid-19: Secondary | ICD-10-CM | POA: Insufficient documentation

## 2020-07-05 DIAGNOSIS — J45909 Unspecified asthma, uncomplicated: Secondary | ICD-10-CM | POA: Insufficient documentation

## 2020-07-05 DIAGNOSIS — R197 Diarrhea, unspecified: Secondary | ICD-10-CM

## 2020-07-05 DIAGNOSIS — B9689 Other specified bacterial agents as the cause of diseases classified elsewhere: Secondary | ICD-10-CM

## 2020-07-05 LAB — URINALYSIS, COMPLETE (UACMP) WITH MICROSCOPIC
Bilirubin Urine: NEGATIVE
Glucose, UA: NEGATIVE mg/dL
Hgb urine dipstick: NEGATIVE
Ketones, ur: 5 mg/dL — AB
Nitrite: NEGATIVE
Protein, ur: 100 mg/dL — AB
Specific Gravity, Urine: 1.035 — ABNORMAL HIGH (ref 1.005–1.030)
pH: 5 (ref 5.0–8.0)

## 2020-07-05 LAB — CBC
HCT: 43.6 % (ref 36.0–46.0)
Hemoglobin: 14.2 g/dL (ref 12.0–15.0)
MCH: 26.5 pg (ref 26.0–34.0)
MCHC: 32.6 g/dL (ref 30.0–36.0)
MCV: 81.3 fL (ref 80.0–100.0)
Platelets: 257 10*3/uL (ref 150–400)
RBC: 5.36 MIL/uL — ABNORMAL HIGH (ref 3.87–5.11)
RDW: 12.8 % (ref 11.5–15.5)
WBC: 7.6 10*3/uL (ref 4.0–10.5)
nRBC: 0 % (ref 0.0–0.2)

## 2020-07-05 LAB — COMPREHENSIVE METABOLIC PANEL
ALT: 14 U/L (ref 0–44)
AST: 22 U/L (ref 15–41)
Albumin: 4.1 g/dL (ref 3.5–5.0)
Alkaline Phosphatase: 64 U/L (ref 38–126)
Anion gap: 14 (ref 5–15)
BUN: 9 mg/dL (ref 6–20)
CO2: 20 mmol/L — ABNORMAL LOW (ref 22–32)
Calcium: 9.3 mg/dL (ref 8.9–10.3)
Chloride: 102 mmol/L (ref 98–111)
Creatinine, Ser: 0.98 mg/dL (ref 0.44–1.00)
GFR, Estimated: >60 mL/min (ref >60)
Glucose, Bld: 93 mg/dL (ref 70–99)
Potassium: 3.2 mmol/L — ABNORMAL LOW (ref 3.5–5.1)
Sodium: 136 mmol/L (ref 135–145)
Total Bilirubin: 0.8 mg/dL (ref 0.3–1.2)
Total Protein: 8.9 g/dL — ABNORMAL HIGH (ref 6.5–8.1)

## 2020-07-05 LAB — MONONUCLEOSIS SCREEN: Mono Screen: NEGATIVE

## 2020-07-05 LAB — LIPASE, BLOOD: Lipase: 22 U/L (ref 11–51)

## 2020-07-05 LAB — RESP PANEL BY RT-PCR (FLU A&B, COVID) ARPGX2
Influenza A by PCR: NEGATIVE
Influenza B by PCR: NEGATIVE
SARS Coronavirus 2 by RT PCR: NEGATIVE

## 2020-07-05 LAB — GROUP A STREP BY PCR: Group A Strep by PCR: NOT DETECTED

## 2020-07-05 LAB — POC URINE PREG, ED: Preg Test, Ur: NEGATIVE

## 2020-07-05 MED ORDER — ACETAMINOPHEN 325 MG PO TABS
650.0000 mg | ORAL_TABLET | Freq: Once | ORAL | Status: AC
  Administered 2020-07-05: 16:00:00 650 mg via ORAL
  Filled 2020-07-05: qty 2

## 2020-07-05 MED ORDER — POTASSIUM CHLORIDE CRYS ER 20 MEQ PO TBCR
20.0000 meq | EXTENDED_RELEASE_TABLET | Freq: Once | ORAL | Status: AC
  Administered 2020-07-05: 17:00:00 20 meq via ORAL
  Filled 2020-07-05: qty 1

## 2020-07-05 MED ORDER — AMOXICILLIN 875 MG PO TABS: 875.0000 mg | ORAL_TABLET | Freq: Two times a day (BID) | ORAL | 0 refills | Status: DC

## 2020-07-05 NOTE — ED Triage Notes (Signed)
To ED via POV with c/o sore throat, generalized body aches, diarrhea, and fever. Pt with mild fever on arrival to ED.

## 2020-07-05 NOTE — Discharge Instructions (Addendum)
Follow-up with your regular doctor if not improving in 2 to 3 days.  Return emergency department worsening.  Did a mono test 2-year labs which is not returned yet.  I will call to let you know the results. If this test is positive do not take the amoxicillin.  If negative you should take the amoxicillin. Tylenol and ibuprofen for fever as needed.  Drink plenty of fluids

## 2020-07-05 NOTE — ED Notes (Signed)
See triage note. Pt alert and sitting calmly in bed. Resp reg/unlabored. Skin dry. Pt sounds nasally congested. Denies any pain currently.

## 2020-07-05 NOTE — ED Provider Notes (Signed)
[facility] Emergency Department Provider Note  ____________________________________________   Event Date/Time   First MD Initiated Contact with Patient 07/05/20 1611     (approximate)  I have reviewed the triage vital signs and the nursing notes.   HISTORY  Chief Complaint Sore Throat, Generalized Body Aches, and Diarrhea    HPI Gabriela Anderson is a 19 y.o. female presents to the emergency department complaint of sore throat, body aches, diarrhea and fever. Patient states her son is also been sick. Patient is fully vaccinated for Covid but did not get a flu shot. States the sore throat has been ongoing since Tuesday. Fever started Wednesday. She states 3 episodes of diarrhea but no vomiting.    Past Medical History:  Diagnosis Date   Anemia    Anxiety    Asthma    Depression     Patient Active Problem List   Diagnosis Date Noted   Nexplanon in place 01/04/2020   Alpha thalassemia silent carrier 11/07/2019    Past Surgical History:  Procedure Laterality Date   MYRINGOTOMY     NO PAST SURGERIES      Prior to Admission medications   Medication Sig Start Date End Date Taking? Authorizing Provider  albuterol (VENTOLIN HFA) 108 (90 Base) MCG/ACT inhaler Inhale 2 puffs into the lungs as needed.    [provider]  amoxicillin (AMOXIL) 875 MG tablet Take 1 tablet (875 mg total) by mouth 2 (two) times daily. 07/05/20   Khyron Garno, Roselyn Bering, PA-C  cetirizine (ZYRTEC) 10 MG tablet Take 10 mg by mouth as needed.    [provider]  Multiple Vitamin (MULTIVITAMIN ADULT PO) Take by mouth.    [provider]  Prenatal Vit-Fe Fumarate-FA (MULTIVITAMIN-PRENATAL) 27-0.8 MG TABS tablet Take 1 tablet by mouth daily at 12 noon.    [provider]    Allergies Patient has no known allergies.  Family History  Problem Relation Age of Onset   Heart disease Paternal Grandmother     Social History Social History    Tobacco Use   Smoking status: Never Smoker   Smokeless tobacco: Never Used  Building services engineer Use: Never used  Substance Use Topics   Alcohol use: No   Drug use: No    Review of Systems  Constitutional: Positive fever/chills Eyes: No visual changes. ENT: Positive sore throat. Respiratory: Denies cough Cardiovascular: Denies chest pain Gastrointestinal: Denies abdominal pain Genitourinary: Negative for dysuria. Musculoskeletal: Negative for back pain. Skin: Negative for rash. Psychiatric: no mood changes,     ____________________________________________   PHYSICAL EXAM:  VITAL SIGNS: ED Triage Vitals [07/05/20 1523]  Enc Vitals Group     BP 117/76     Pulse Rate (!) 126     Resp (!) 24     Temp (!) 100.9 F (38.3 C)     Temp Source Oral     SpO2 100 %     Weight 233 lb 7.5 oz (105.9 kg)     Height 5\' 8"  (1.727 m)     Head Circumference      Peak Flow      Pain Score 0     Pain Loc      Pain Edu?      Excl. in GC?     Constitutional: Alert and oriented. Well appearing and in no acute distress. Eyes: Conjunctivae are normal.  Head: Atraumatic. Nose: No congestion/rhinnorhea. Mouth/Throat: Mucous membranes are moist. Throat is red with some  exudate noted posteriorly Neck:  supple no lymphadenopathy noted Cardiovascular: Normal rate, regular rhythm. Heart sounds are normal Respiratory: Normal respiratory effort.  No retractions, lungs c t a  Abd: soft nontender bs normal all 4 quad GU: deferred Musculoskeletal: FROM all extremities, warm and well perfused Neurologic:  Normal speech and language.  Skin:  Skin is warm, dry and intact. No rash noted. Psychiatric: Mood and affect are normal. Speech and behavior are normal.  ____________________________________________   LABS (all labs ordered are listed, but only abnormal results are displayed)  Labs Reviewed  COMPREHENSIVE METABOLIC PANEL - Abnormal; Notable for the following components:       Result Value   Potassium 3.2 (*)    CO2 20 (*)    Total Protein 8.9 (*)    All other components within normal limits  CBC - Abnormal; Notable for the following components:   RBC 5.36 (*)    All other components within normal limits  URINALYSIS, COMPLETE (UACMP) WITH MICROSCOPIC - Abnormal; Notable for the following components:   Color, Urine AMBER (*)    APPearance HAZY (*)    Specific Gravity, Urine 1.035 (*)    Ketones, ur 5 (*)    Protein, ur 100 (*)    Leukocytes,Ua TRACE (*)    Bacteria, UA MANY (*)    All other components within normal limits  RESP PANEL BY RT-PCR (FLU A&B, COVID) ARPGX2  GROUP A STREP BY PCR  URINE CULTURE  LIPASE, BLOOD  MONONUCLEOSIS SCREEN  POC URINE PREG, ED   ____________________________________________   ____________________________________________  RADIOLOGY    ____________________________________________   PROCEDURES  Procedure(s) performed: No  Procedures    ____________________________________________   INITIAL IMPRESSION / ASSESSMENT AND PLAN / ED COURSE  Pertinent labs & imaging results that were available during my care of the patient were reviewed by me and considered in my medical decision making (see chart for details).   Patient is 19 year old female presents with Covid/flulike symptoms. See HPI. Physical exam is consistent with influenza or Covid.  DDx: Influenza, Covid, UTI, gastroenteritis, strep throat  CBC is normal, comprehensive metabolic panel shows decreased potassium at 3.2, urinalysis is amber in color along with being hazy and has a trace of leukocytes, POC pregnancy is negative  Strep and respiratory panel are both pending, did add a urine culture     [name] was evaluated in Emergency Department on 07/06/2020 for the symptoms described in the history of present illness. She was evaluated in the context of the global COVID-19 pandemic, which necessitated consideration that the patient might be  at risk for infection with the SARS-CoV-2 virus that causes COVID-19. Institutional protocols and algorithms that pertain to the evaluation of patients at risk for COVID-19 are in a state of rapid change based on information released by regulatory bodies including the CDC and federal and state organizations. These policies and algorithms were followed during the patient's care in the ED.    As part of my medical decision making, I reviewed the following data within the electronic MEDICAL RECORD NUMBER Nursing notes reviewed and incorporated, Labs reviewed , Old chart reviewed, Notes from prior ED visits and  Controlled Substance Database  ____________________________________________   FINAL CLINICAL IMPRESSION(S) / ED DIAGNOSES  Final diagnoses:  Acute bacterial tonsillitis  Diarrhea, unspecified type  Fever in adult      NEW MEDICATIONS STARTED DURING THIS VISIT:  Discharge Medication List as of 07/05/2020  5:34 PM    START taking these  medications   Details  amoxicillin (AMOXIL) 875 MG tablet Take 1 tablet (875 mg total) by mouth 2 (two) times daily., Starting Fri 07/05/2020, Print         Note:  This document was prepared using Dragon voice recognition software and may include unintentional dictation errors.    Faythe Ghee, PA-C 07/06/20 Eldridge Dace    Jene Every, MD 07/09/20 [phone]

## 2020-07-07 LAB — URINE CULTURE

## 2020-07-10 DIAGNOSIS — F331 Major depressive disorder, recurrent, moderate: Secondary | ICD-10-CM | POA: Diagnosis not present

## 2020-07-10 DIAGNOSIS — F411 Generalized anxiety disorder: Secondary | ICD-10-CM | POA: Diagnosis not present

## 2020-07-20 DIAGNOSIS — Z419 Encounter for procedure for purposes other than remedying health state, unspecified: Secondary | ICD-10-CM | POA: Diagnosis not present

## 2020-07-24 DIAGNOSIS — F411 Generalized anxiety disorder: Secondary | ICD-10-CM | POA: Diagnosis not present

## 2020-07-24 DIAGNOSIS — F331 Major depressive disorder, recurrent, moderate: Secondary | ICD-10-CM | POA: Diagnosis not present

## 2020-08-07 DIAGNOSIS — F331 Major depressive disorder, recurrent, moderate: Secondary | ICD-10-CM | POA: Diagnosis not present

## 2020-08-07 DIAGNOSIS — F411 Generalized anxiety disorder: Secondary | ICD-10-CM | POA: Diagnosis not present

## 2020-08-14 DIAGNOSIS — F411 Generalized anxiety disorder: Secondary | ICD-10-CM | POA: Diagnosis not present

## 2020-08-14 DIAGNOSIS — F331 Major depressive disorder, recurrent, moderate: Secondary | ICD-10-CM | POA: Diagnosis not present

## 2020-08-20 DIAGNOSIS — Z419 Encounter for procedure for purposes other than remedying health state, unspecified: Secondary | ICD-10-CM | POA: Diagnosis not present

## 2020-08-21 DIAGNOSIS — F331 Major depressive disorder, recurrent, moderate: Secondary | ICD-10-CM | POA: Diagnosis not present

## 2020-08-21 DIAGNOSIS — F411 Generalized anxiety disorder: Secondary | ICD-10-CM | POA: Diagnosis not present

## 2020-08-28 DIAGNOSIS — F411 Generalized anxiety disorder: Secondary | ICD-10-CM | POA: Diagnosis not present

## 2020-08-28 DIAGNOSIS — F331 Major depressive disorder, recurrent, moderate: Secondary | ICD-10-CM | POA: Diagnosis not present

## 2020-09-11 DIAGNOSIS — F411 Generalized anxiety disorder: Secondary | ICD-10-CM | POA: Diagnosis not present

## 2020-09-11 DIAGNOSIS — F331 Major depressive disorder, recurrent, moderate: Secondary | ICD-10-CM | POA: Diagnosis not present

## 2020-09-17 DIAGNOSIS — Z419 Encounter for procedure for purposes other than remedying health state, unspecified: Secondary | ICD-10-CM | POA: Diagnosis not present

## 2020-09-18 DIAGNOSIS — F411 Generalized anxiety disorder: Secondary | ICD-10-CM | POA: Diagnosis not present

## 2020-09-18 DIAGNOSIS — F331 Major depressive disorder, recurrent, moderate: Secondary | ICD-10-CM | POA: Diagnosis not present

## 2020-09-23 ENCOUNTER — Encounter: Payer: Medicaid Other | Admitting: Certified Nurse Midwife

## 2020-09-25 DIAGNOSIS — F331 Major depressive disorder, recurrent, moderate: Secondary | ICD-10-CM | POA: Diagnosis not present

## 2020-09-25 DIAGNOSIS — F411 Generalized anxiety disorder: Secondary | ICD-10-CM | POA: Diagnosis not present

## 2020-10-02 DIAGNOSIS — F411 Generalized anxiety disorder: Secondary | ICD-10-CM | POA: Diagnosis not present

## 2020-10-02 DIAGNOSIS — F331 Major depressive disorder, recurrent, moderate: Secondary | ICD-10-CM | POA: Diagnosis not present

## 2020-10-09 DIAGNOSIS — F411 Generalized anxiety disorder: Secondary | ICD-10-CM | POA: Diagnosis not present

## 2020-10-09 DIAGNOSIS — F331 Major depressive disorder, recurrent, moderate: Secondary | ICD-10-CM | POA: Diagnosis not present

## 2020-10-11 ENCOUNTER — Encounter: Payer: Medicaid Other | Admitting: Certified Nurse Midwife

## 2020-10-11 DIAGNOSIS — Z1159 Encounter for screening for other viral diseases: Secondary | ICD-10-CM

## 2020-10-11 DIAGNOSIS — Z124 Encounter for screening for malignant neoplasm of cervix: Secondary | ICD-10-CM

## 2020-10-16 DIAGNOSIS — F411 Generalized anxiety disorder: Secondary | ICD-10-CM | POA: Diagnosis not present

## 2020-10-16 DIAGNOSIS — F331 Major depressive disorder, recurrent, moderate: Secondary | ICD-10-CM | POA: Diagnosis not present

## 2020-10-18 DIAGNOSIS — Z419 Encounter for procedure for purposes other than remedying health state, unspecified: Secondary | ICD-10-CM | POA: Diagnosis not present

## 2020-10-23 DIAGNOSIS — F411 Generalized anxiety disorder: Secondary | ICD-10-CM | POA: Diagnosis not present

## 2020-10-23 DIAGNOSIS — F331 Major depressive disorder, recurrent, moderate: Secondary | ICD-10-CM | POA: Diagnosis not present

## 2020-10-24 ENCOUNTER — Encounter: Payer: Self-pay | Admitting: Certified Nurse Midwife

## 2020-11-06 DIAGNOSIS — F331 Major depressive disorder, recurrent, moderate: Secondary | ICD-10-CM | POA: Diagnosis not present

## 2020-11-06 DIAGNOSIS — F411 Generalized anxiety disorder: Secondary | ICD-10-CM | POA: Diagnosis not present

## 2020-11-12 DIAGNOSIS — J302 Other seasonal allergic rhinitis: Secondary | ICD-10-CM | POA: Diagnosis not present

## 2020-11-12 DIAGNOSIS — Z889 Allergy status to unspecified drugs, medicaments and biological substances status: Secondary | ICD-10-CM | POA: Diagnosis not present

## 2020-11-13 DIAGNOSIS — F411 Generalized anxiety disorder: Secondary | ICD-10-CM | POA: Diagnosis not present

## 2020-11-13 DIAGNOSIS — F331 Major depressive disorder, recurrent, moderate: Secondary | ICD-10-CM | POA: Diagnosis not present

## 2020-11-17 DIAGNOSIS — Z419 Encounter for procedure for purposes other than remedying health state, unspecified: Secondary | ICD-10-CM | POA: Diagnosis not present

## 2020-11-20 DIAGNOSIS — F331 Major depressive disorder, recurrent, moderate: Secondary | ICD-10-CM | POA: Diagnosis not present

## 2020-11-20 DIAGNOSIS — F411 Generalized anxiety disorder: Secondary | ICD-10-CM | POA: Diagnosis not present

## 2020-11-22 ENCOUNTER — Encounter: Payer: Self-pay | Admitting: Certified Nurse Midwife

## 2020-11-22 ENCOUNTER — Other Ambulatory Visit: Payer: Self-pay

## 2020-11-22 ENCOUNTER — Other Ambulatory Visit (HOSPITAL_COMMUNITY)
Admission: RE | Admit: 2020-11-22 | Discharge: 2020-11-22 | Disposition: A | Discharge status: Home / Self Care | Payer: Medicaid Other | Source: Ambulatory Visit | Attending: Certified Nurse Midwife | Admitting: Certified Nurse Midwife

## 2020-11-22 ENCOUNTER — Ambulatory Visit (INDEPENDENT_AMBULATORY_CARE_PROVIDER_SITE_OTHER): Payer: Medicaid Other | Admitting: Certified Nurse Midwife

## 2020-11-22 VITALS — BP 118/79 | HR 73 | Resp 16 | Ht 69.0 in | Wt 258.7 lb

## 2020-11-22 DIAGNOSIS — Z124 Encounter for screening for malignant neoplasm of cervix: Secondary | ICD-10-CM | POA: Diagnosis not present

## 2020-11-22 DIAGNOSIS — Z0000 Encounter for general adult medical examination without abnormal findings: Secondary | ICD-10-CM

## 2020-11-22 DIAGNOSIS — N921 Excessive and frequent menstruation with irregular cycle: Secondary | ICD-10-CM | POA: Diagnosis not present

## 2020-11-22 DIAGNOSIS — Z01419 Encounter for gynecological examination (general) (routine) without abnormal findings: Secondary | ICD-10-CM

## 2020-11-22 DIAGNOSIS — Z975 Presence of (intrauterine) contraceptive device: Secondary | ICD-10-CM

## 2020-11-22 NOTE — Patient Instructions (Signed)
Preventive Care 21-21 Years Old, Female Preventive care refers to lifestyle choices and visits with your health care provider that can promote health and wellness. This includes:  A yearly physical exam. This is also called an annual wellness visit.  Regular dental and eye exams.  Immunizations.  Screening for certain conditions.  Healthy lifestyle choices, such as: ? Eating a healthy diet. ? Getting regular exercise. ? Not using drugs or products that contain nicotine and tobacco. ? Limiting alcohol use. What can I expect for my preventive care visit? Physical exam Your health care provider may check your:  Height and weight. These may be used to calculate your BMI (body mass index). BMI is a measurement that tells if you are at a healthy weight.  Heart rate and blood pressure.  Body temperature.  Skin for abnormal spots. Counseling Your health care provider may ask you questions about your:  Past medical problems.  Family's medical history.  Alcohol, tobacco, and drug use.  Emotional well-being.  Home life and relationship well-being.  Sexual activity.  Diet, exercise, and sleep habits.  Work and work environment.  Access to firearms.  Method of birth control.  Menstrual cycle.  Pregnancy history. What immunizations do I need? Vaccines are usually given at various ages, according to a schedule. Your health care provider will recommend vaccines for you based on your age, medical history, and lifestyle or other factors, such as travel or where you work.   What tests do I need? Blood tests  Lipid and cholesterol levels. These may be checked every 5 years starting at age 20.  Hepatitis C test.  Hepatitis B test. Screening  Diabetes screening. This is done by checking your blood sugar (glucose) after you have not eaten for a while (fasting).  STD (sexually transmitted disease) testing, if you are at risk.  BRCA-related cancer screening. This may be  done if you have a family history of breast, ovarian, tubal, or peritoneal cancers.  Pelvic exam and Pap test. This may be done every 3 years starting at age 21. Starting at age 30, this may be done every 5 years if you have a Pap test in combination with an HPV test. Talk with your health care provider about your test results, treatment options, and if necessary, the need for more tests.   Follow these instructions at home: Eating and drinking  Eat a healthy diet that includes fresh fruits and vegetables, whole grains, lean protein, and low-fat dairy products.  Take vitamin and mineral supplements as recommended by your health care provider.  Do not drink alcohol if: ? Your health care provider tells you not to drink. ? You are pregnant, may be pregnant, or are planning to become pregnant.  If you drink alcohol: ? Limit how much you have to 0-1 drink a day. ? Be aware of how much alcohol is in your drink. In the [place] one drink equals one 12 oz bottle of beer (355 mL), one 5 oz glass of wine (148 mL), or one 1 oz glass of hard liquor (44 mL).   Lifestyle  Take daily care of your teeth and gums. Brush your teeth every morning and night with fluoride toothpaste. Floss one time each day.  Stay active. Exercise for at least 30 minutes 5 or more days each week.  Do not use any products that contain nicotine or tobacco, such as cigarettes, e-cigarettes, and chewing tobacco. If you need help quitting, ask your health care provider.  Do not   use drugs.  If you are sexually active, practice safe sex. Use a condom or other form of protection to prevent STIs (sexually transmitted infections).  If you do not wish to become pregnant, use a form of birth control. If you plan to become pregnant, see your health care provider for a prepregnancy visit.  Find healthy ways to cope with stress, such as: ? Meditation, yoga, or listening to music. ? Journaling. ? Talking to a trusted  person. ? Spending time with friends and family. Safety  Always wear your seat belt while driving or riding in a vehicle.  Do not drive: ? If you have been drinking alcohol. Do not ride with someone who has been drinking. ? When you are tired or distracted. ? While texting.  Wear a helmet and other protective equipment during sports activities.  If you have firearms in your house, make sure you follow all gun safety procedures.  Seek help if you have been physically or sexually abused. What's next?  Go to your health care provider once a year for an annual wellness visit.  Ask your health care provider how often you should have your eyes and teeth checked.  Stay up to date on all vaccines. This information is not intended to replace advice given to you by your health care provider. Make sure you discuss any questions you have with your health care provider. Document Revised: 03/03/2020 Document Reviewed: 03/17/2018 Elsevier Patient Education  2021 [place] implant What is this medicine? ETONOGESTREL (et oh noe JES trel) is a contraceptive (birth control) device. It is used to prevent pregnancy. It can be used for up to 3 years. This medicine may be used for other purposes; ask your health care provider or pharmacist if you have questions. COMMON BRAND NAME(S): Implanon, Nexplanon What should I tell my health care provider before I take this medicine? They need to know if you have any of these conditions:  abnormal vaginal bleeding  blood vessel disease or blood clots  breast, cervical, endometrial, ovarian, liver, or uterine cancer  diabetes  gallbladder disease  heart disease or recent heart attack  high blood pressure  high cholesterol or triglycerides  kidney disease  liver disease  migraine headaches  seizures  stroke  tobacco smoker  an unusual or allergic reaction to etonogestrel, anesthetics or antiseptics, other medicines,  foods, dyes, or preservatives  pregnant or trying to get pregnant  breast-feeding How should I use this medicine? This device is inserted just under the skin on the inner side of your upper arm by a health care professional. Talk to your pediatrician regarding the use of this medicine in children. Special care may be needed. Overdosage: If you think you have taken too much of this medicine contact a poison control center or emergency room at once. NOTE: This medicine is only for you. Do not share this medicine with others. What if I miss a dose? This does not apply. What may interact with this medicine? Do not take this medicine with any of the following medications:  amprenavir  fosamprenavir This medicine may also interact with the following medications:  acitretin  aprepitant  armodafinil  bexarotene  bosentan  carbamazepine  certain medicines for fungal infections like fluconazole, ketoconazole, itraconazole and voriconazole  certain medicines to treat hepatitis, HIV or AIDS  cyclosporine  felbamate  griseofulvin  lamotrigine  modafinil  oxcarbazepine  phenobarbital  phenytoin  primidone  rifabutin  rifampin  rifapentine  [facility] John's  wort  topiramate This list may not describe all possible interactions. Give your health care provider a list of all the medicines, herbs, non-prescription drugs, or dietary supplements you use. Also tell them if you smoke, drink alcohol, or use illegal drugs. Some items may interact with your medicine. What should I watch for while using this medicine? This product does not protect you against HIV infection (AIDS) or other sexually transmitted diseases. You should be able to feel the implant by pressing your fingertips over the skin where it was inserted. Contact your doctor if you cannot feel the implant, and use a non-hormonal birth control method (such as condoms) until your doctor confirms that the implant is in  place. Contact your doctor if you think that the implant may have broken or become bent while in your arm. You will receive a user card from your health care provider after the implant is inserted. The card is a record of the location of the implant in your upper arm and when it should be removed. Keep this card with your health records. What side effects may I notice from receiving this medicine? Side effects that you should report to your doctor or health care professional as soon as possible:  allergic reactions like skin rash, itching or hives, swelling of the face, lips, or tongue  breast lumps, breast tissue changes, or discharge  breathing problems  changes in emotions or moods  coughing up blood  if you feel that the implant may have broken or bent while in your arm  high blood pressure  pain, irritation, swelling, or bruising at the insertion site  scar at site of insertion  signs of infection at the insertion site such as fever, and skin redness, pain or discharge  signs and symptoms of a blood clot such as breathing problems; changes in vision; chest pain; severe, sudden headache; pain, swelling, warmth in the leg; trouble speaking; sudden numbness or weakness of the face, arm or leg  signs and symptoms of liver injury like dark yellow or brown urine; general ill feeling or flu-like symptoms; light-colored stools; loss of appetite; nausea; right upper belly pain; unusually weak or tired; yellowing of the eyes or skin  unusual vaginal bleeding, discharge Side effects that usually do not require medical attention (report to your doctor or health care professional if they continue or are bothersome):  acne  breast pain or tenderness  headache  irregular menstrual bleeding  nausea This list may not describe all possible side effects. Call your doctor for medical advice about side effects. You may report side effects to FDA at 1-800-FDA-1088. Where should I keep my  medicine? This drug is given in a hospital or clinic and will not be stored at home. NOTE: This sheet is a summary. It may not cover all possible information. If you have questions about this medicine, talk to your doctor, pharmacist, or health care provider.  2021 Elsevier/Gold Standard (2019-04-18 11:33:04)

## 2020-11-22 NOTE — Progress Notes (Signed)
ANNUAL PREVENTATIVE CARE GYN  ENCOUNTER NOTE  Subjective:       Gabriela Anderson is a 20 y.o. G13P1001 female here for a routine annual gynecologic exam.  Current complaints: 1. Breakthrough bleeding on Nexplanon  Denies difficulty breathing or respiratory distress, chest pain, abdominal pain, excessive vaginal bleeding, dysuria, and leg pain or swelling.    Gynecologic History  Patient's last menstrual period was 11/22/2020 (exact date). Period Pattern: (!) Irregular Menstrual Flow: Moderate Dysmenorrhea: None  Contraception: Nexplanon  Last Pap: due  Obstetric History OB History  Gravida Para Term Preterm AB Living  1 1 1     1   SAB IAB Ectopic Multiple Live Births        0 1    # Outcome Date GA Lbr Len/2nd Weight Sex Delivery Anes PTL Lv  1 Term 11/08/19 [redacted]w[redacted]d 21:52 / 00:31 7 lb 4.8 oz (3.31 kg) M Vag-Spont   LIV    Past Medical History:  Diagnosis Date  . Anemia   . Anxiety   . Asthma   . Depression     Past Surgical History:  Procedure Laterality Date  . MYRINGOTOMY      Current Outpatient Medications on File Prior to Visit  Medication Sig Dispense Refill  . albuterol (VENTOLIN HFA) 108 (90 Base) MCG/ACT inhaler Inhale 2 puffs into the lungs as needed.    . budesonide (RHINOCORT AQUA) 32 MCG/ACT nasal spray SMARTSIG:1-2 Spray(s) Both Nares Daily PRN    . cetirizine (ZYRTEC) 10 MG tablet Take 10 mg by mouth as needed.    . loratadine (CLARITIN) 10 MG tablet Take 10 mg by mouth daily.    . montelukast (SINGULAIR) 10 MG tablet SMARTSIG:1 Tablet(s) By Mouth Every Evening    . Multiple Vitamin (MULTIVITAMIN ADULT PO) Take by mouth.     No current facility-administered medications on file prior to visit.    No Known Allergies  Social History   Socioeconomic History  . Marital status: Single    Spouse name: Not on file  . Number of children: Not on file  . Years of education: Not on file  . Highest education level: Not on file  Occupational History  .  Not on file  Tobacco Use  . Smoking status: Never Smoker  . Smokeless tobacco: Never Used  Vaping Use  . Vaping Use: Never used  Substance and Sexual Activity  . Alcohol use: No  . Drug use: No  . Sexual activity: Not Currently    Birth control/protection: Implant  Other Topics Concern  . Not on file  Social History Narrative  . Not on file   Social Determinants of Health   Financial Resource Strain: Not on file  Food Insecurity: Not on file  Transportation Needs: Not on file  Physical Activity: Not on file  Stress: Not on file  Social Connections: Not on file  Intimate Partner Violence: Not on file    Family History  Problem Relation Age of Onset  . Heart disease Paternal Grandmother     The following portions of the patient's history were reviewed and updated as appropriate: allergies, current medications, past family history, past medical history, past social history, past surgical history and problem list.  Review of Systems  ROS negative except as noted above. Information obtained from patient.    Objective:   BP 118/79   Pulse 73   Resp 16   Ht 5\' 9"  (1.753 m)   Wt 258 lb 11.2 oz (117.3 kg)  LMP 11/22/2020 (Exact Date)   BMI 38.20 kg/m    CONSTITUTIONAL: Well-developed, well-nourished female in no acute distress.   PSYCHIATRIC: Normal mood and affect. Normal behavior. Normal judgment and thought content.  NEUROLGIC: Alert and oriented to person, place, and time. Normal muscle tone coordination. No cranial nerve deficit noted.  HENT:  Normocephalic, atraumatic.  EYES: Conjunctivae and EOM are normal.   NECK: Normal range of motion, supple, no masses.  Normal thyroid.   SKIN: Skin is warm and dry. No rash noted. Not diaphoretic. No erythema. No pallor. Nexplanon present in left arm.   CARDIOVASCULAR: Normal heart rate noted, regular rhythm, no murmur.  RESPIRATORY: Clear to auscultation bilaterally. Effort and breath sounds normal, no problems  with respiration noted.  BREASTS: Symmetric in size. No masses, skin changes, nipple drainage, or lymphadenopathy.  ABDOMEN: Soft, normal bowel sounds, no distention  noted.  No tenderness, rebound or guarding.   PELVIC:  External Genitalia: Normal  Vagina: Normal  Cervix: Normal, Pap collected  Uterus: Normal  Adnexa: Normal   MUSCULOSKELETAL: Normal range of motion. No tenderness.  No cyanosis, clubbing, or edema.  2+ distal pulses.  LYMPHATIC: No Axillary, Supraclavicular, or Inguinal Adenopathy.  Assessment:   Annual gynecologic examination 20 y.o.   Contraception: Nexplanon   Obesity 2   Problem List Items Addressed This Visit      Other   Nexplanon in place    Other Visit Diagnoses    Well woman exam    -  Primary   Relevant Orders   Cytology - PAP   CBC   Estradiol   FSH/LH   TSH   Screening for cervical cancer       Relevant Orders   Cytology - PAP   Breakthrough bleeding on Nexplanon       Relevant Orders   CBC   Estradiol   FSH/LH   TSH      Plan:   Pap: Pap, Reflex if ASCUS   Labs: See orders  Routine preventative health maintenance measures emphasized: Exercise/Diet/Weight control, Tobacco Warnings, Alcohol/Substance use risks, Stress Management, Peer Pressure Issues and Safe Sex; see AVS  Samples of OCP given  Reviewed red flag symptoms and when to call  Return to Clinic - 1 Year for [facility] or sooner if needed   Serafina Royals, CNM  [facility], [facility] 11/22/20 4:03 PM

## 2020-11-23 LAB — CBC
Hematocrit: 39.2 % (ref 34.0–46.6)
Hemoglobin: 12.8 g/dL (ref 11.1–15.9)
MCH: 26.7 pg (ref 26.6–33.0)
MCHC: 32.7 g/dL (ref 31.5–35.7)
MCV: 82 fL (ref 79–97)
Platelets: 416 10*3/uL (ref 150–450)
RBC: 4.79 x10E6/uL (ref 3.77–5.28)
RDW: 12.9 % (ref 11.7–15.4)
WBC: 6.4 10*3/uL (ref 3.4–10.8)

## 2020-11-23 LAB — ESTRADIOL: Estradiol: 37 pg/mL

## 2020-11-23 LAB — TSH: TSH: 0.989 u[IU]/mL (ref 0.450–4.500)

## 2020-11-23 LAB — FSH/LH
FSH: 4.8 m[IU]/mL
LH: 3.9 m[IU]/mL

## 2020-11-26 LAB — CYTOLOGY - PAP: Diagnosis: NEGATIVE

## 2020-12-18 DIAGNOSIS — F411 Generalized anxiety disorder: Secondary | ICD-10-CM | POA: Diagnosis not present

## 2020-12-18 DIAGNOSIS — F331 Major depressive disorder, recurrent, moderate: Secondary | ICD-10-CM | POA: Diagnosis not present

## 2020-12-18 DIAGNOSIS — Z419 Encounter for procedure for purposes other than remedying health state, unspecified: Secondary | ICD-10-CM | POA: Diagnosis not present

## 2020-12-20 DIAGNOSIS — Z01812 Encounter for preprocedural laboratory examination: Secondary | ICD-10-CM | POA: Diagnosis not present

## 2020-12-20 DIAGNOSIS — Z20822 Contact with and (suspected) exposure to covid-19: Secondary | ICD-10-CM | POA: Diagnosis not present

## 2020-12-24 DIAGNOSIS — Z20822 Contact with and (suspected) exposure to covid-19: Secondary | ICD-10-CM | POA: Diagnosis not present

## 2020-12-24 DIAGNOSIS — M2669 Other specified disorders of temporomandibular joint: Secondary | ICD-10-CM | POA: Diagnosis not present

## 2020-12-24 DIAGNOSIS — M2602 Maxillary hypoplasia: Secondary | ICD-10-CM | POA: Diagnosis not present

## 2020-12-24 DIAGNOSIS — J45909 Unspecified asthma, uncomplicated: Secondary | ICD-10-CM | POA: Diagnosis not present

## 2020-12-26 ENCOUNTER — Telehealth: Payer: Self-pay

## 2020-12-26 NOTE — Telephone Encounter (Signed)
Transition Care Management Follow-up Telephone Call Date of discharge and from where: 12/25/2020 from [facility] How have you been since you were released from the hospital? Pt states she is feeling well and has no questions or concerns at this time.  Any questions or concerns? No  Items Reviewed: Did the pt receive and understand the discharge instructions provided? Yes  Medications obtained and verified? Yes  Other? No  Any new allergies since your discharge? No  Dietary orders reviewed? Yes Do you have support at home? Yes   Functional Questionnaire: (I = Independent and D = Dependent) ADLs: I  Bathing/Dressing- I  Meal Prep- I  Eating- I  Maintaining continence- I  Transferring/Ambulation- I  Managing Meds- I   Follow up appointments reviewed:  PCP Hospital f/u appt confirmed? No   Specialist Hospital f/u appt confirmed? Yes  Follow up with surgeon Are transportation arrangements needed? No  If their condition worsens, is the pt aware to call PCP or go to the Emergency Dept.? Yes Was the patient provided with contact information for the PCP's office or ED? Yes Was to pt encouraged to call back with questions or concerns? Yes

## 2021-01-17 DIAGNOSIS — Z419 Encounter for procedure for purposes other than remedying health state, unspecified: Secondary | ICD-10-CM | POA: Diagnosis not present

## 2021-01-20 DIAGNOSIS — Z20822 Contact with and (suspected) exposure to covid-19: Secondary | ICD-10-CM | POA: Diagnosis not present

## 2021-01-20 DIAGNOSIS — Z03818 Encounter for observation for suspected exposure to other biological agents ruled out: Secondary | ICD-10-CM | POA: Diagnosis not present

## 2021-01-22 DIAGNOSIS — F331 Major depressive disorder, recurrent, moderate: Secondary | ICD-10-CM | POA: Diagnosis not present

## 2021-01-22 DIAGNOSIS — F411 Generalized anxiety disorder: Secondary | ICD-10-CM | POA: Diagnosis not present

## 2021-01-28 DIAGNOSIS — Z20822 Contact with and (suspected) exposure to covid-19: Secondary | ICD-10-CM | POA: Diagnosis not present

## 2021-01-28 DIAGNOSIS — Z03818 Encounter for observation for suspected exposure to other biological agents ruled out: Secondary | ICD-10-CM | POA: Diagnosis not present

## 2021-02-12 DIAGNOSIS — F331 Major depressive disorder, recurrent, moderate: Secondary | ICD-10-CM | POA: Diagnosis not present

## 2021-02-12 DIAGNOSIS — F411 Generalized anxiety disorder: Secondary | ICD-10-CM | POA: Diagnosis not present

## 2021-02-17 DIAGNOSIS — Z419 Encounter for procedure for purposes other than remedying health state, unspecified: Secondary | ICD-10-CM | POA: Diagnosis not present

## 2021-02-26 DIAGNOSIS — F331 Major depressive disorder, recurrent, moderate: Secondary | ICD-10-CM | POA: Diagnosis not present

## 2021-02-26 DIAGNOSIS — F411 Generalized anxiety disorder: Secondary | ICD-10-CM | POA: Diagnosis not present

## 2021-03-12 DIAGNOSIS — F331 Major depressive disorder, recurrent, moderate: Secondary | ICD-10-CM | POA: Diagnosis not present

## 2021-03-12 DIAGNOSIS — F411 Generalized anxiety disorder: Secondary | ICD-10-CM | POA: Diagnosis not present

## 2021-03-18 ENCOUNTER — Encounter: Payer: Medicaid Other | Admitting: Obstetrics and Gynecology

## 2021-03-20 DIAGNOSIS — Z419 Encounter for procedure for purposes other than remedying health state, unspecified: Secondary | ICD-10-CM | POA: Diagnosis not present

## 2021-04-09 ENCOUNTER — Encounter: Payer: Medicaid Other | Admitting: Obstetrics and Gynecology

## 2021-04-09 DIAGNOSIS — F411 Generalized anxiety disorder: Secondary | ICD-10-CM | POA: Diagnosis not present

## 2021-04-09 DIAGNOSIS — F331 Major depressive disorder, recurrent, moderate: Secondary | ICD-10-CM | POA: Diagnosis not present

## 2021-04-10 ENCOUNTER — Encounter: Payer: Self-pay | Admitting: Obstetrics and Gynecology

## 2021-04-19 DIAGNOSIS — Z419 Encounter for procedure for purposes other than remedying health state, unspecified: Secondary | ICD-10-CM | POA: Diagnosis not present

## 2021-04-24 DIAGNOSIS — F411 Generalized anxiety disorder: Secondary | ICD-10-CM | POA: Diagnosis not present

## 2021-04-24 DIAGNOSIS — F331 Major depressive disorder, recurrent, moderate: Secondary | ICD-10-CM | POA: Diagnosis not present

## 2021-05-06 ENCOUNTER — Encounter: Payer: Medicaid Other | Admitting: Obstetrics and Gynecology

## 2021-05-07 ENCOUNTER — Encounter: Payer: Self-pay | Admitting: Obstetrics and Gynecology

## 2021-05-07 DIAGNOSIS — F411 Generalized anxiety disorder: Secondary | ICD-10-CM | POA: Diagnosis not present

## 2021-05-07 DIAGNOSIS — F331 Major depressive disorder, recurrent, moderate: Secondary | ICD-10-CM | POA: Diagnosis not present

## 2021-05-20 DIAGNOSIS — Z419 Encounter for procedure for purposes other than remedying health state, unspecified: Secondary | ICD-10-CM | POA: Diagnosis not present

## 2021-05-21 DIAGNOSIS — F411 Generalized anxiety disorder: Secondary | ICD-10-CM | POA: Diagnosis not present

## 2021-05-21 DIAGNOSIS — F331 Major depressive disorder, recurrent, moderate: Secondary | ICD-10-CM | POA: Diagnosis not present

## 2021-05-30 ENCOUNTER — Other Ambulatory Visit: Payer: Self-pay

## 2021-05-30 ENCOUNTER — Encounter: Payer: Self-pay | Admitting: Obstetrics and Gynecology

## 2021-05-30 ENCOUNTER — Ambulatory Visit (INDEPENDENT_AMBULATORY_CARE_PROVIDER_SITE_OTHER): Payer: Medicaid Other | Admitting: Obstetrics and Gynecology

## 2021-05-30 ENCOUNTER — Other Ambulatory Visit (HOSPITAL_COMMUNITY)
Admission: RE | Admit: 2021-05-30 | Discharge: 2021-05-30 | Disposition: A | Discharge status: Home / Self Care | Payer: Medicaid Other | Source: Ambulatory Visit | Attending: Obstetrics and Gynecology | Admitting: Obstetrics and Gynecology

## 2021-05-30 VITALS — BP 111/71 | HR 91 | Ht 69.0 in | Wt 277.9 lb

## 2021-05-30 DIAGNOSIS — Z113 Encounter for screening for infections with a predominantly sexual mode of transmission: Secondary | ICD-10-CM

## 2021-05-30 DIAGNOSIS — N898 Other specified noninflammatory disorders of vagina: Secondary | ICD-10-CM | POA: Diagnosis not present

## 2021-05-30 DIAGNOSIS — R202 Paresthesia of skin: Secondary | ICD-10-CM | POA: Diagnosis not present

## 2021-05-30 DIAGNOSIS — Z0000 Encounter for general adult medical examination without abnormal findings: Secondary | ICD-10-CM | POA: Diagnosis not present

## 2021-05-30 DIAGNOSIS — Z1389 Encounter for screening for other disorder: Secondary | ICD-10-CM | POA: Diagnosis not present

## 2021-05-30 NOTE — Progress Notes (Signed)
    GYNECOLOGY PROGRESS NOTE  Subjective:    Patient ID: Gabriela Anderson, female    DOB: [birth year], 20 y.o.   MRN: [id number]  HPI  Patient is a 20 y.o. G17P1001 female who presents for STD screening. She did have vaginal odor since early September, but she took antibiotics that she had previously (took 3 days worth and then lost pill bottle) and the odor resolved.  She would like to be screened for STDs. She denies having symptoms currently.   The following portions of the patient's history were reviewed and updated as appropriate: allergies, current medications, past family history, past medical history, past social history, past surgical history, and problem list.  Review of Systems Pertinent items noted in HPI and remainder of comprehensive ROS otherwise negative.   Objective:   Blood pressure 111/71, pulse 91, height 5\' 9"  (1.753 m), weight 277 lb 14.4 oz (126.1 kg), last menstrual period 03/18/2021, currently breastfeeding. Body mass index is 41.04 kg/m. General appearance: alert, cooperative, and no distress Abdomen: soft, non-tender; bowel sounds normal; no masses,  no organomegaly Pelvic: deferred. Patient allowed to self-swab.    Assessment:   1. Vaginal odor   2. Screen for STD (sexually transmitted disease)    Plan:   Patient notes resolution of symptoms at this time but still desires to be screened. Vaginal swab performed today.  Will notify of results through mychart.     03/20/2021, MD [facility].

## 2021-06-03 LAB — CERVICOVAGINAL ANCILLARY ONLY
Bacterial Vaginitis (gardnerella): NEGATIVE
Candida Glabrata: NEGATIVE
Candida Vaginitis: NEGATIVE
Chlamydia: NEGATIVE
Comment: NEGATIVE
Comment: NEGATIVE
Comment: NEGATIVE
Comment: NEGATIVE
Comment: NEGATIVE
Comment: NORMAL
Neisseria Gonorrhea: NEGATIVE
Trichomonas: NEGATIVE

## 2021-06-04 DIAGNOSIS — F331 Major depressive disorder, recurrent, moderate: Secondary | ICD-10-CM | POA: Diagnosis not present

## 2021-06-04 DIAGNOSIS — F411 Generalized anxiety disorder: Secondary | ICD-10-CM | POA: Diagnosis not present

## 2021-06-19 DIAGNOSIS — Z419 Encounter for procedure for purposes other than remedying health state, unspecified: Secondary | ICD-10-CM | POA: Diagnosis not present

## 2021-07-02 DIAGNOSIS — F331 Major depressive disorder, recurrent, moderate: Secondary | ICD-10-CM | POA: Diagnosis not present

## 2021-07-02 DIAGNOSIS — F411 Generalized anxiety disorder: Secondary | ICD-10-CM | POA: Diagnosis not present

## 2021-07-16 DIAGNOSIS — F411 Generalized anxiety disorder: Secondary | ICD-10-CM | POA: Diagnosis not present

## 2021-07-16 DIAGNOSIS — F331 Major depressive disorder, recurrent, moderate: Secondary | ICD-10-CM | POA: Diagnosis not present

## 2021-07-20 DIAGNOSIS — Z419 Encounter for procedure for purposes other than remedying health state, unspecified: Secondary | ICD-10-CM | POA: Diagnosis not present

## 2021-08-07 ENCOUNTER — Other Ambulatory Visit: Payer: Self-pay

## 2021-08-07 ENCOUNTER — Encounter (HOSPITAL_COMMUNITY): Payer: Self-pay | Admitting: Emergency Medicine

## 2021-08-07 ENCOUNTER — Ambulatory Visit (HOSPITAL_COMMUNITY)
Admission: EM | Admit: 2021-08-07 | Discharge: 2021-08-07 | Disposition: A | Discharge status: Home / Self Care | Payer: Medicaid Other | Attending: Family Medicine | Admitting: Family Medicine

## 2021-08-07 DIAGNOSIS — R0989 Other specified symptoms and signs involving the circulatory and respiratory systems: Secondary | ICD-10-CM | POA: Diagnosis not present

## 2021-08-07 MED ORDER — PANTOPRAZOLE SODIUM 20 MG PO TBEC: 20.0000 mg | DELAYED_RELEASE_TABLET | Freq: Every day | ORAL | 0 refills | Status: AC

## 2021-08-07 NOTE — ED Provider Notes (Signed)
°  [facility]   [id number] 08/07/21 Arrival Time: 0945  ASSESSMENT & PLAN:  1. Globus pharyngeus    No signs of peritonsillar abscess or throat infection. Discussed possible acid reflux etiology. To begin trial of: Meds ordered this encounter  Medications   pantoprazole (PROTONIX) 20 MG tablet    Sig: Take 1 tablet (20 mg total) by mouth daily.    Dispense:  30 tablet    Refill:  0   Reviewed expectations re: course of current medical issues. Questions answered. Outlined signs and symptoms indicating need for more acute intervention. Patient verbalized understanding. After Visit Summary given.   SUBJECTIVE:  Gabriela Anderson is a 21 y.o. female who reports "feeling like there's something in my throat when I swallow". R sided mostly. First noted approx 5 d ago; stable. Normal PO intake without n/v/d.  OBJECTIVE:  Vitals:   08/07/21 1036  BP: 121/84  Pulse: 93  Resp: 20  Temp: 98.2 F (36.8 C)  TempSrc: Oral  SpO2: 99%     General appearance: alert; no distress HEENT: throat without erythema or tonsil abnormality; uvula is midline Neck: supple with FROM; no lymphadenopathy Lungs: speaks full sentences without difficulty; unlabored Abd: soft; non-tender Skin: reveals no rash; warm and dry Psychological: alert and cooperative; normal mood and affect  No Known Allergies  Past Medical History:  Diagnosis Date   Anemia    Anxiety    Asthma    Depression    Social History   Socioeconomic History   Marital status: Single    Spouse name: Not on file   Number of children: Not on file   Years of education: Not on file   Highest education level: Not on file  Occupational History   Not on file  Tobacco Use   Smoking status: Never   Smokeless tobacco: Never  Vaping Use   Vaping Use: Every day  Substance and Sexual Activity   Alcohol use: No   Drug use: No   Sexual activity: Not Currently    Birth control/protection: Implant  Other Topics Concern    Not on file  Social History Narrative   Not on file   Social Determinants of Health   Financial Resource Strain: Not on file  Food Insecurity: Not on file  Transportation Needs: Not on file  Physical Activity: Not on file  Stress: Not on file  Social Connections: Not on file  Intimate Partner Violence: Not on file   Family History  Problem Relation Age of Onset   Diabetes Mother    Diabetes Father    Heart disease Paternal Dulce Sellar, MD 08/07/21 [phone]

## 2021-08-07 NOTE — ED Triage Notes (Addendum)
Onset 08/03/2021.  Symptoms are gradually getting worse.  Complains of sore throat on right side of throat.  Patient is concerned for a tonsillar stone, right ear pain, denies runny nose or cough.

## 2021-08-08 IMAGING — CR DG CHEST 2V
1 series · 2 of 2 positions shown · non-contrast
Comparison: None.

CLINICAL DATA: Shortness of breath and abdominal pain.

EXAM:
CHEST - 2 VIEW

[Series 1: dg chest 2 view · 0.14mm/px · 2 of 2 slices shown]
[im 1/2]
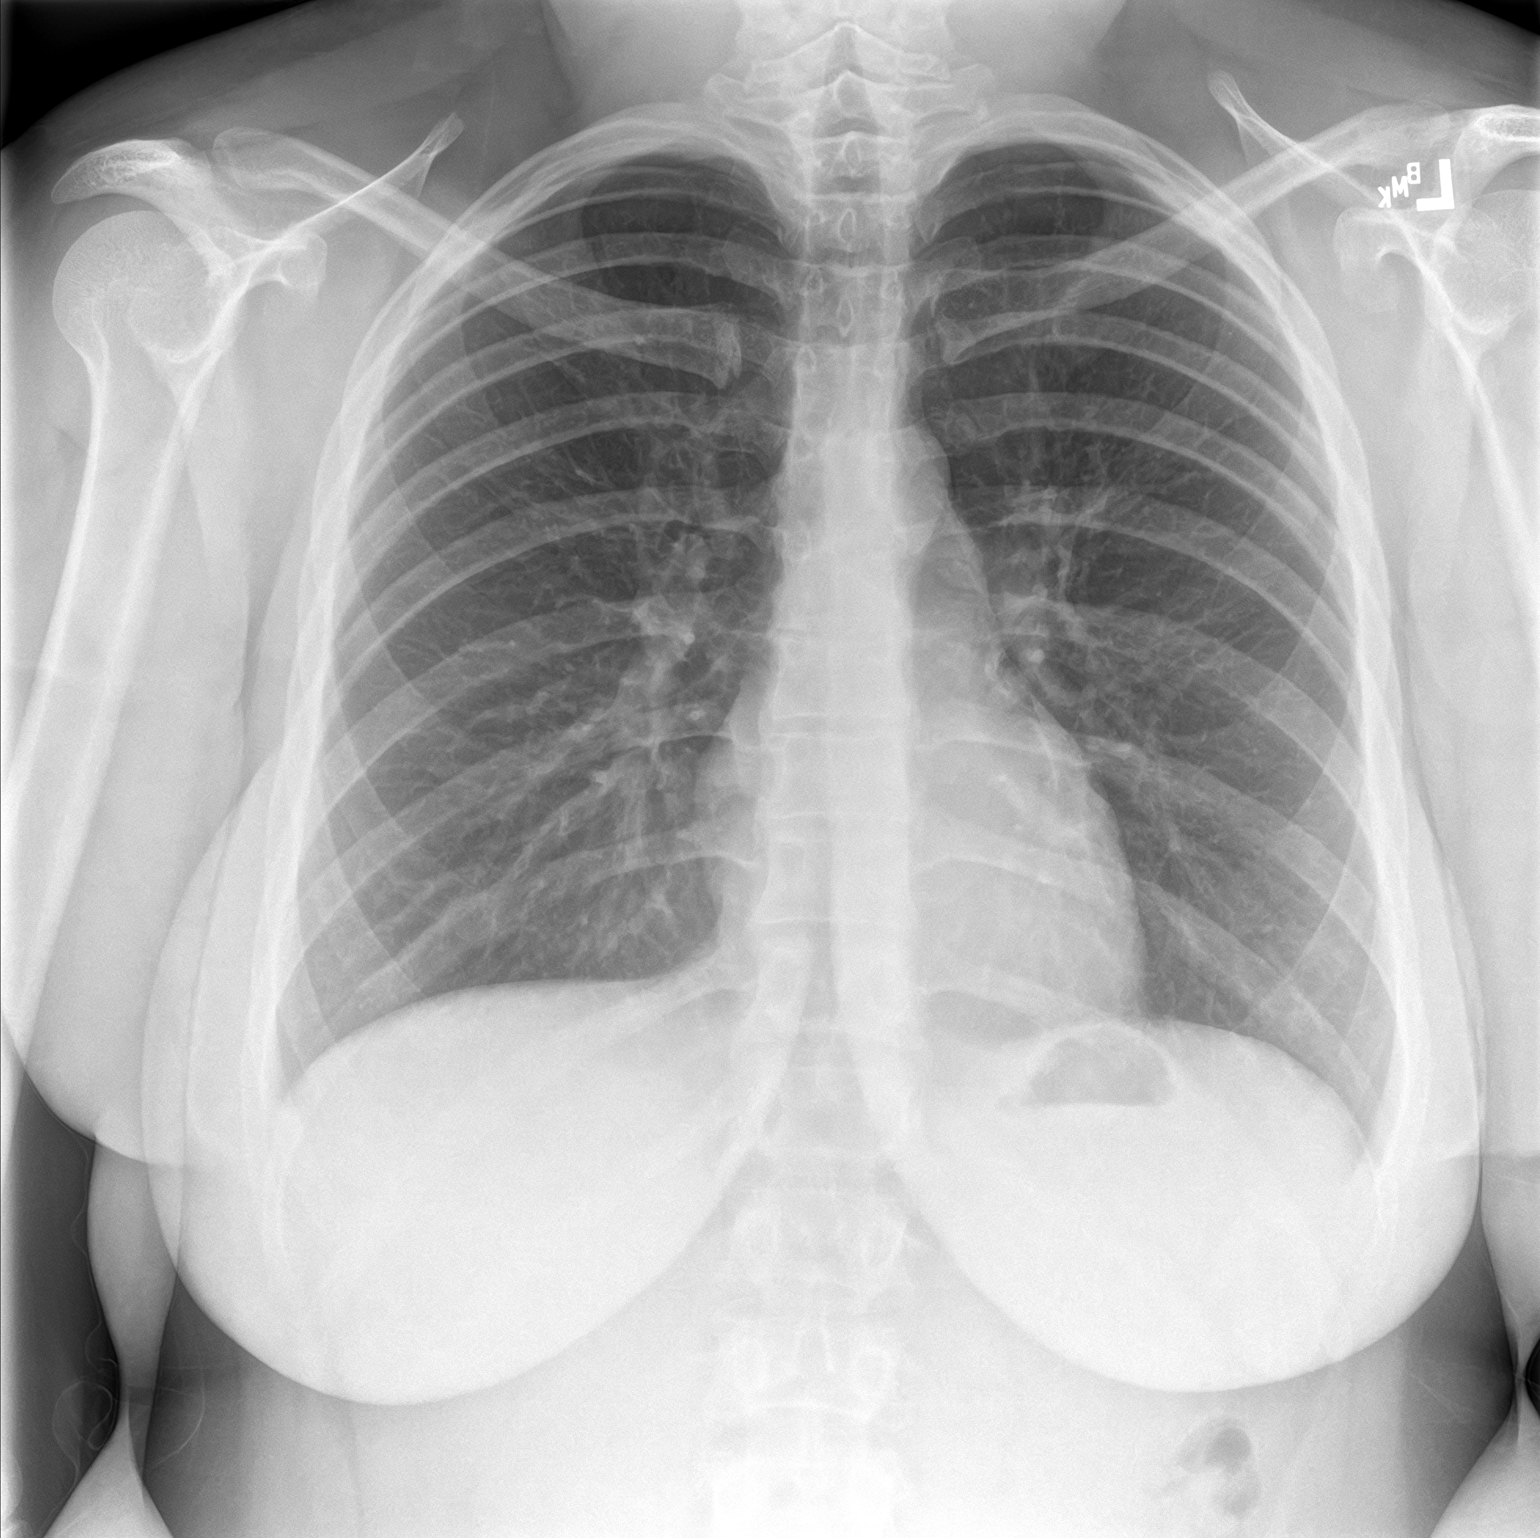
[im 2/2]
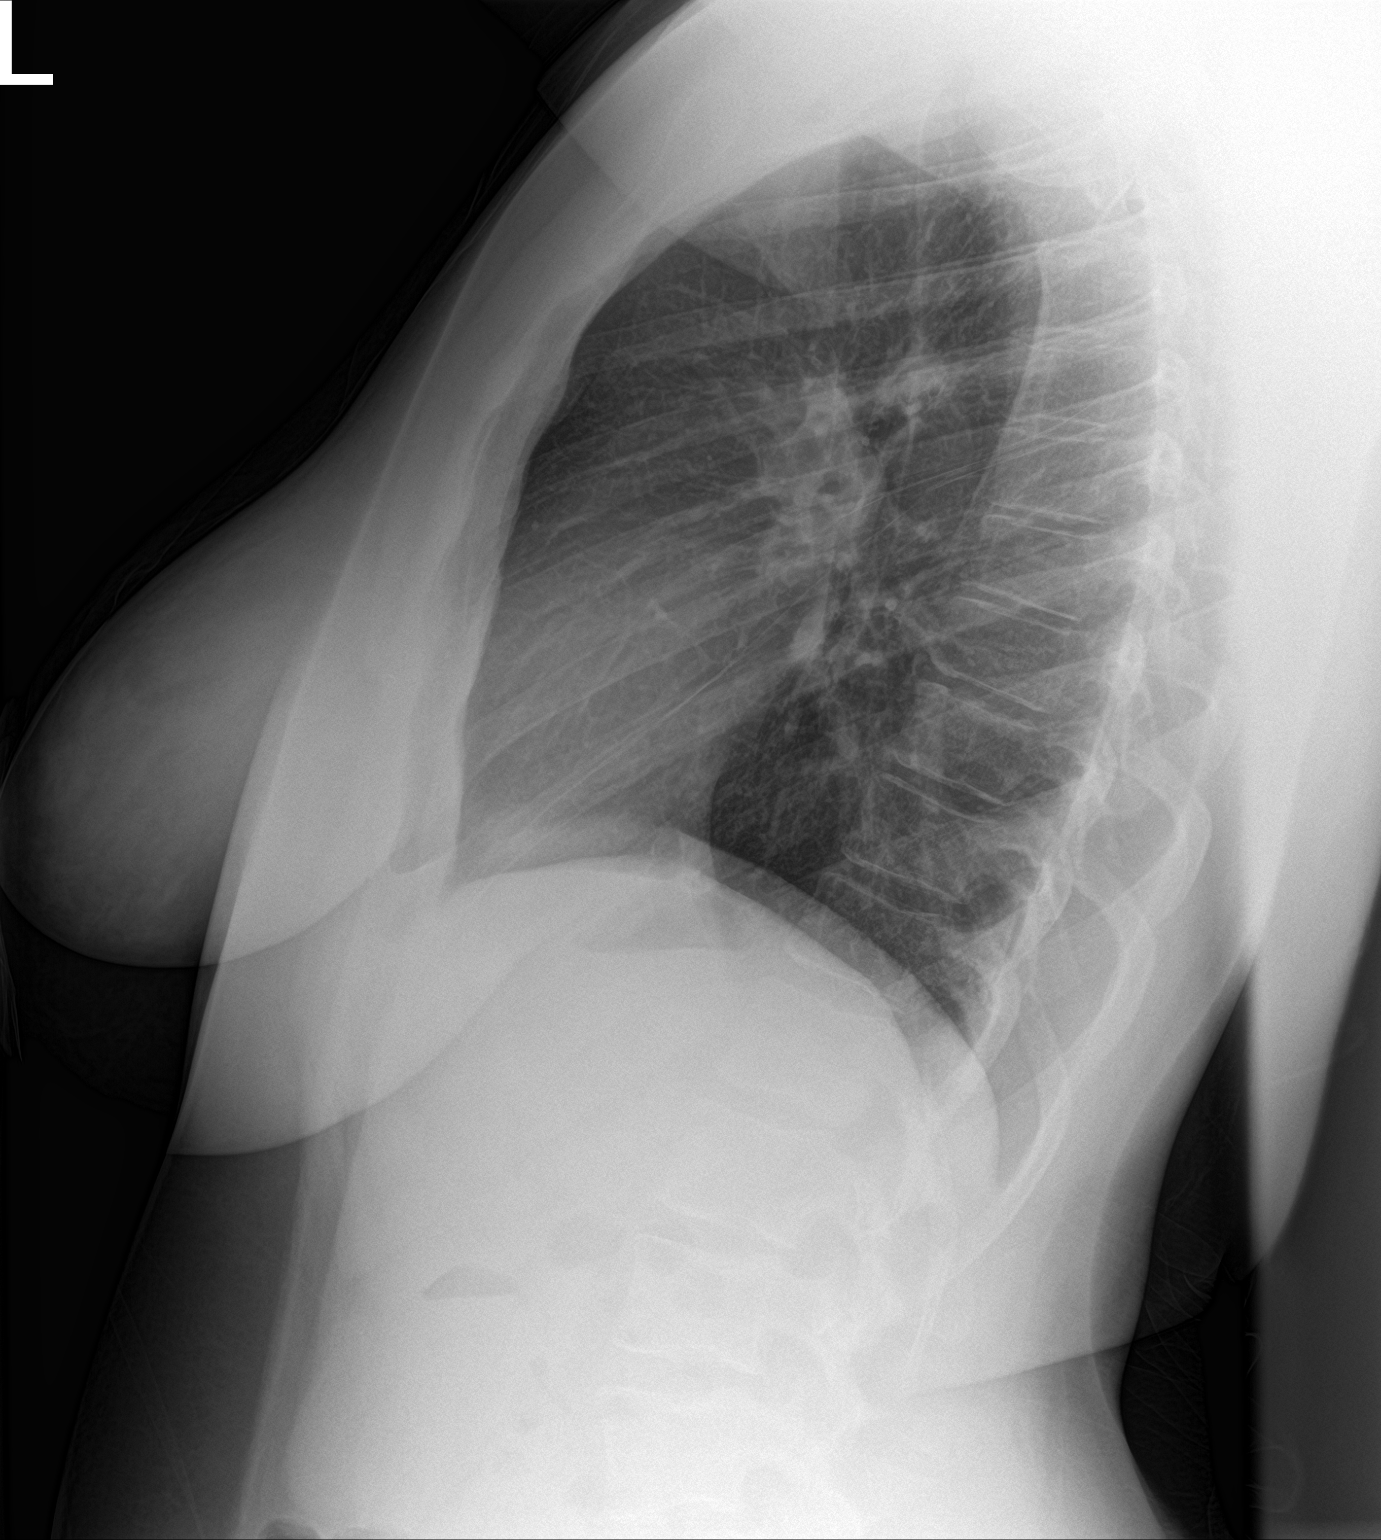

[2 of 2 positions shown; findings below may reference images not displayed]

FINDINGS: The heart size and mediastinal contours are within normal limits.
Both lungs are clear. The visualized skeletal structures are
unremarkable.
IMPRESSION: No active cardiopulmonary disease.

## 2021-08-08 IMAGING — US US PELVIS COMPLETE WITH TRANSVAGINAL
1 series · 13 of 25 positions shown · non-contrast
Comparison: 02/08/2020

CLINICAL DATA: Vaginal bleeding since [REDACTED]

EXAM:
TRANSABDOMINAL AND TRANSVAGINAL ULTRASOUND OF PELVIS
TECHNIQUE: Both transabdominal and transvaginal ultrasound examinations of the
pelvis were performed. Transabdominal technique was performed for
global imaging of the pelvis including uterus, ovaries, adnexal
regions, and pelvic cul-de-sac. It was necessary to proceed with
endovaginal exam following the transabdominal exam to visualize the
ovaries and endometrium.

[Series 1: us pelvic complete with transvaginal · 13 of 180 slices shown]
[im 1/180]
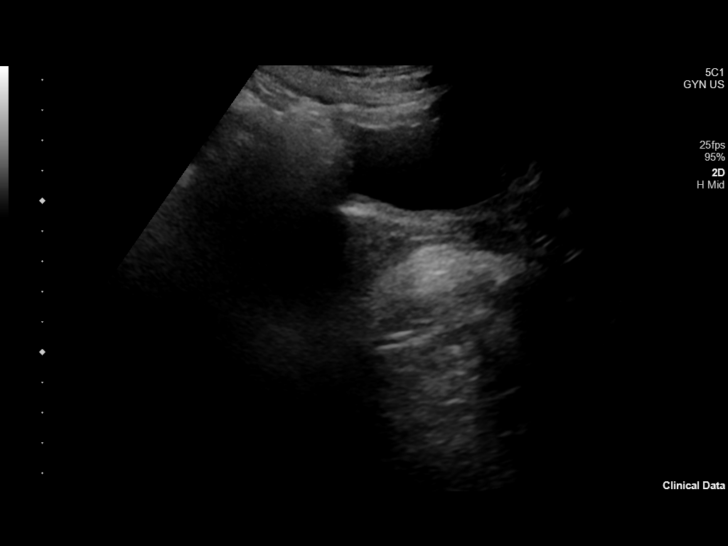
[im 15/180]
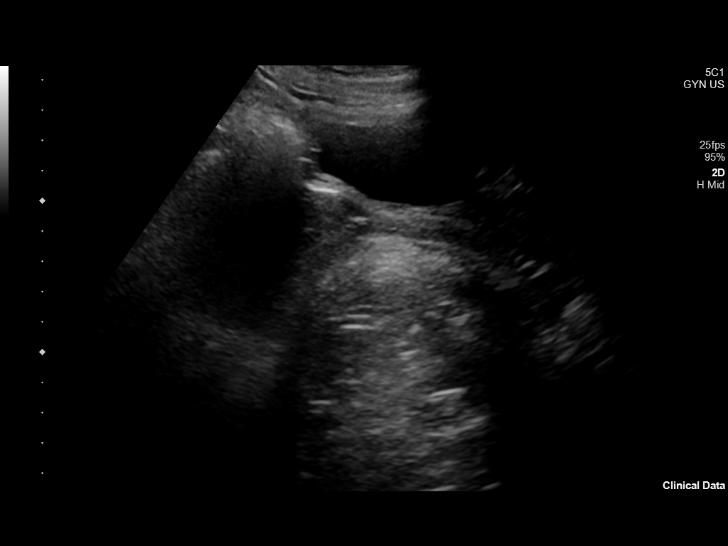
[im 30/180]
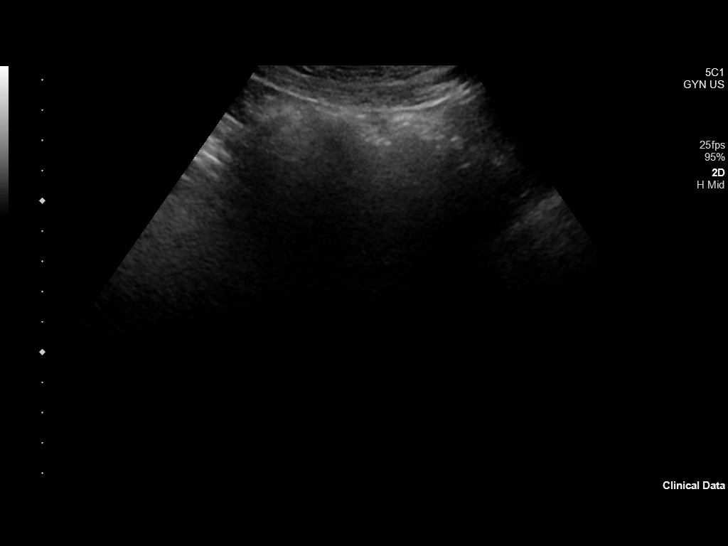
[im 45/180]
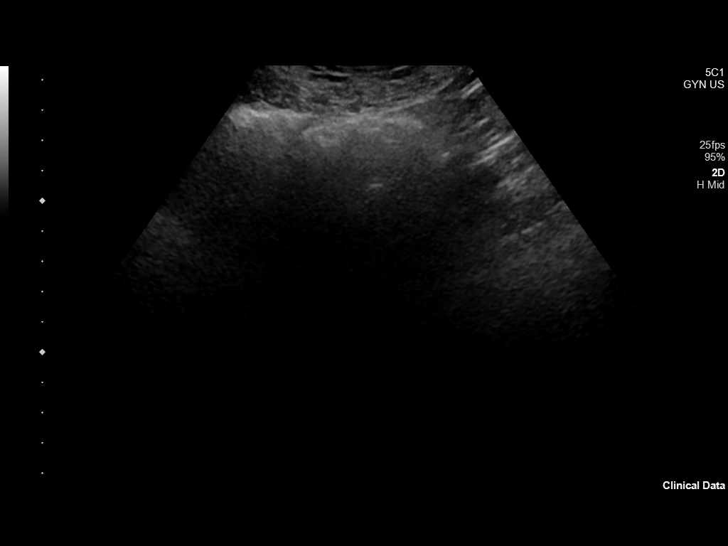
[im 60/180]
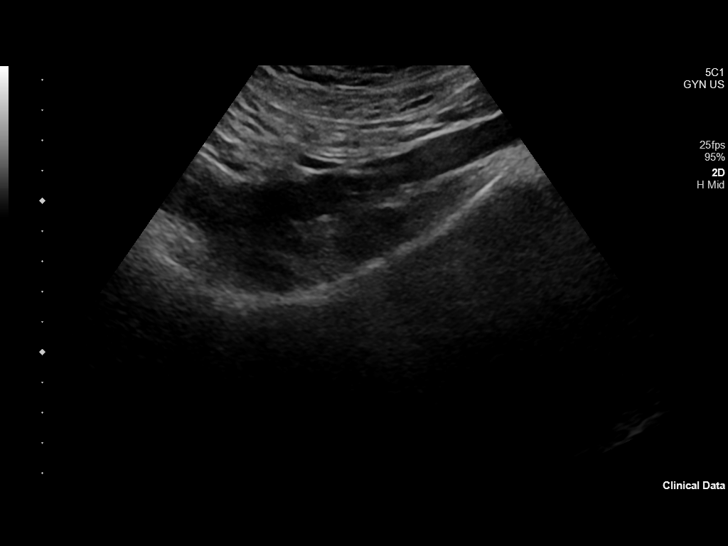
[im 75/180]
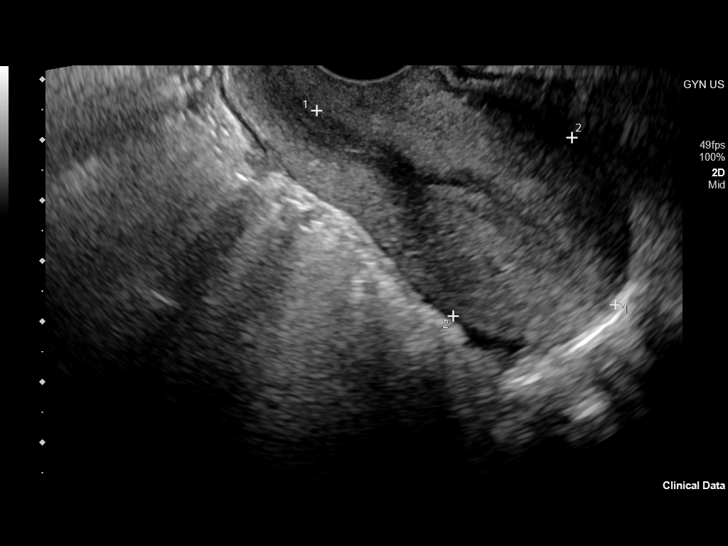
[im 90/180]
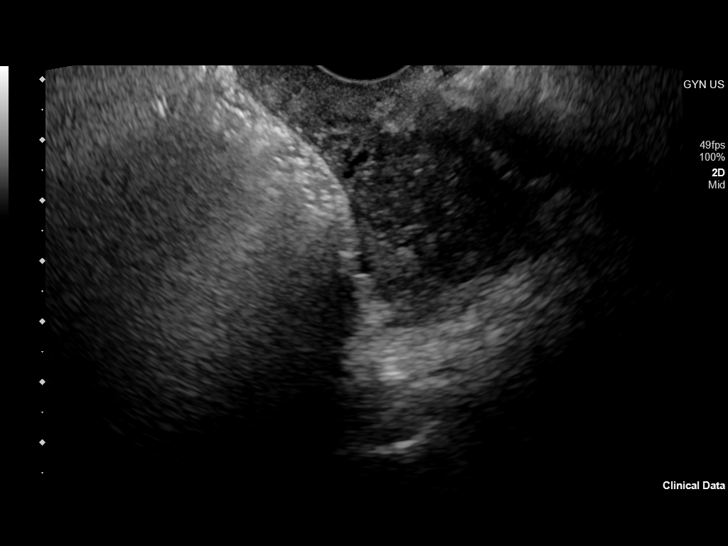
[im 105/180]
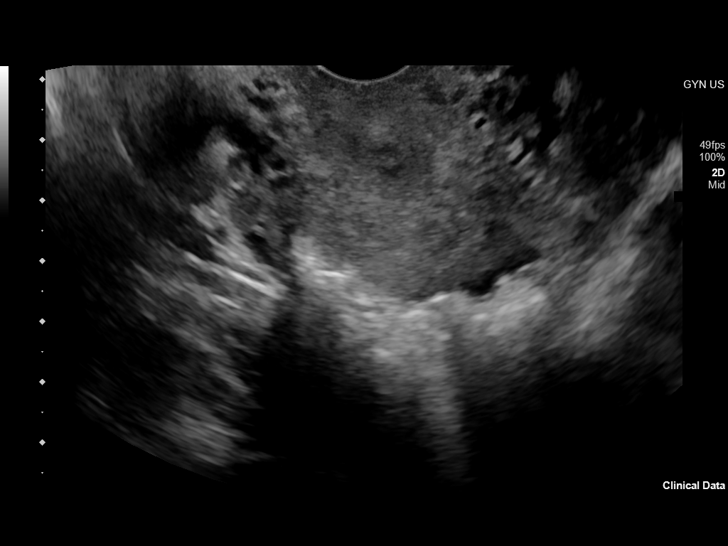
[im 120/180]
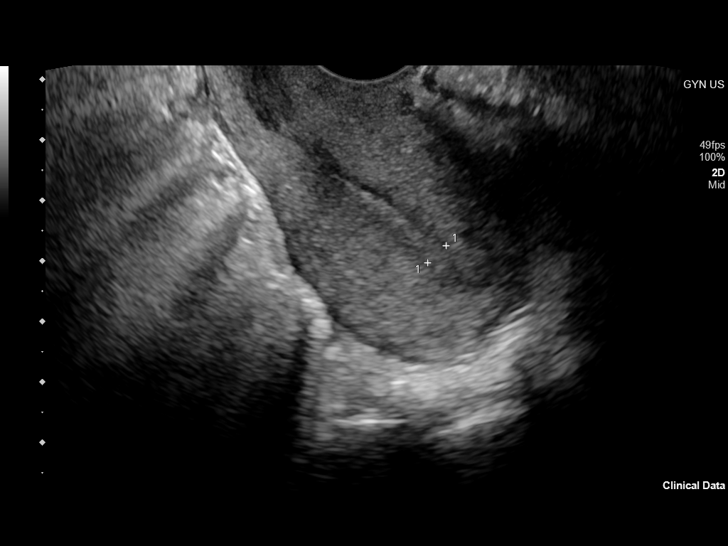
[im 135/180]
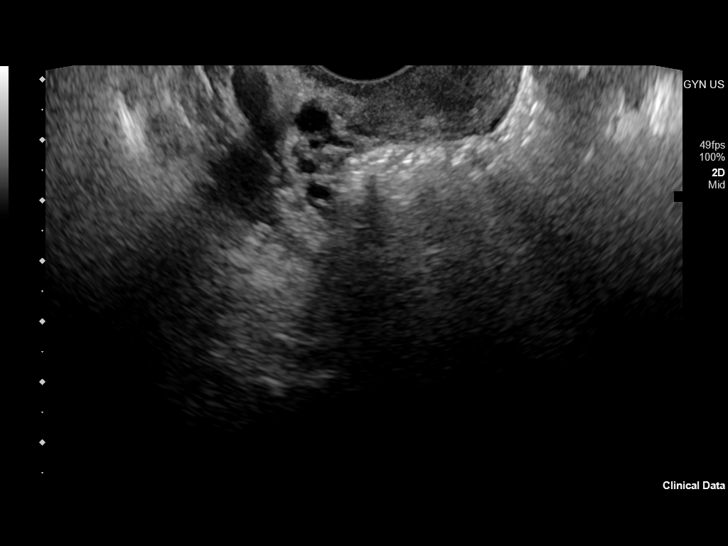
[im 150/180]
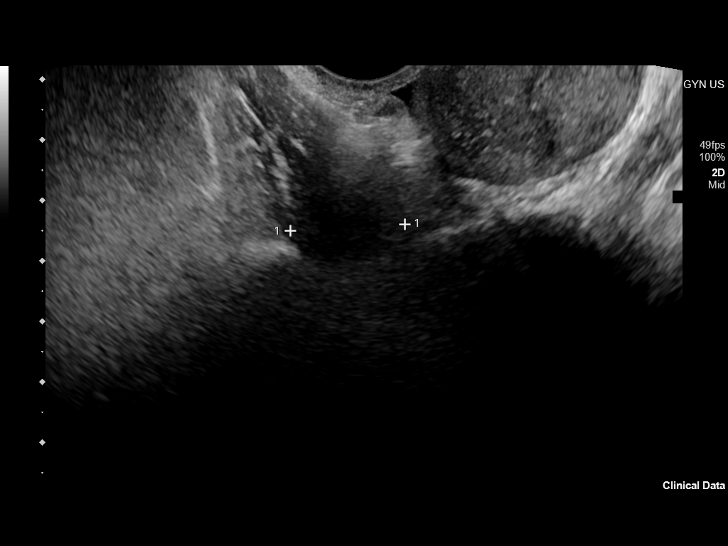
[im 165/180]
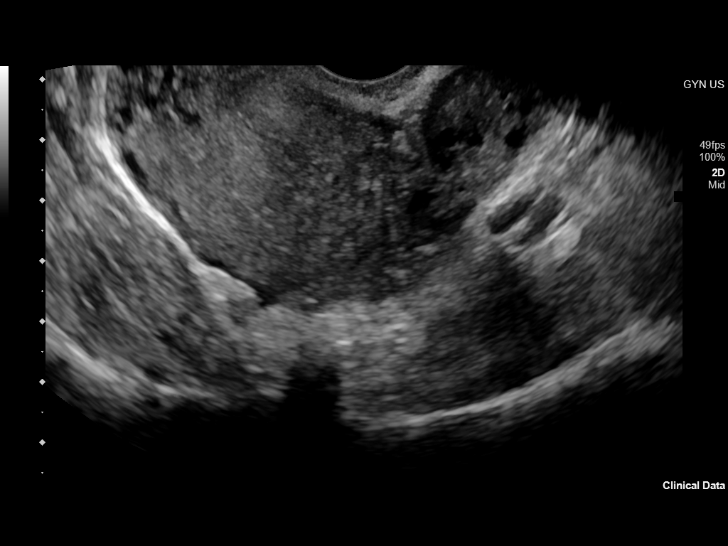
[im 180/180]
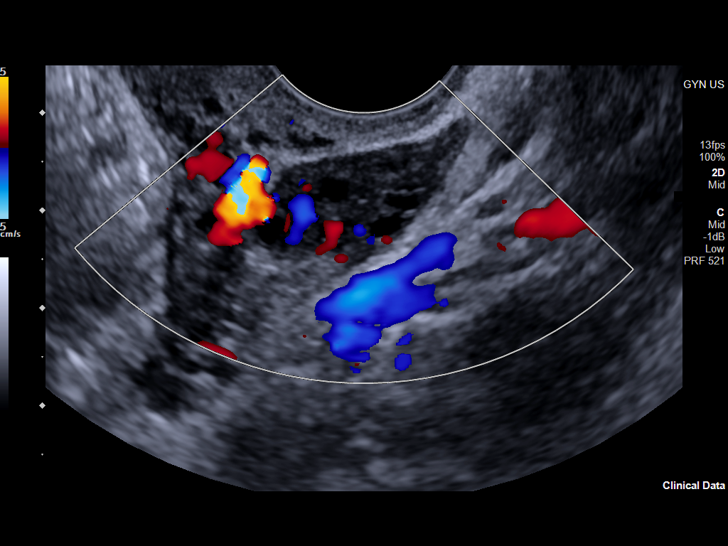

[13 of 25 positions shown; findings below may reference images not displayed]

FINDINGS: Uterus

Measurements: 5.9 x 3.5 x 5.1 cm = volume: 56 mL. Uterus is
retroverted. No myometrial mass lesions identified. Homogeneous
appearance.

Endometrium

Thickness: 4 mm.  No focal abnormality visualized.

Right ovary

Measurements: 2.5 x 1.6 x 1.9 cm = volume: 4 mL. Normal
appearance/no adnexal mass.

Left ovary

Measurements: 2.2 x 1.1 x 1.5 cm = volume: 2 mL. Normal
appearance/no adnexal mass.

Other findings

Minimal free fluid in the pelvis. Flow is demonstrated on both
ovaries on color flow Doppler imaging.
IMPRESSION: Normal ultrasound appearance of the uterus and ovaries. Minimal free
fluid in the pelvis is likely physiologic.

## 2021-08-12 DIAGNOSIS — F331 Major depressive disorder, recurrent, moderate: Secondary | ICD-10-CM | POA: Diagnosis not present

## 2021-08-12 DIAGNOSIS — F411 Generalized anxiety disorder: Secondary | ICD-10-CM | POA: Diagnosis not present

## 2021-08-20 DIAGNOSIS — Z419 Encounter for procedure for purposes other than remedying health state, unspecified: Secondary | ICD-10-CM | POA: Diagnosis not present

## 2021-08-27 DIAGNOSIS — F331 Major depressive disorder, recurrent, moderate: Secondary | ICD-10-CM | POA: Diagnosis not present

## 2021-08-27 DIAGNOSIS — F411 Generalized anxiety disorder: Secondary | ICD-10-CM | POA: Diagnosis not present

## 2021-09-17 DIAGNOSIS — Z419 Encounter for procedure for purposes other than remedying health state, unspecified: Secondary | ICD-10-CM | POA: Diagnosis not present

## 2021-09-24 DIAGNOSIS — F331 Major depressive disorder, recurrent, moderate: Secondary | ICD-10-CM | POA: Diagnosis not present

## 2021-09-24 DIAGNOSIS — F411 Generalized anxiety disorder: Secondary | ICD-10-CM | POA: Diagnosis not present

## 2021-10-18 DIAGNOSIS — Z419 Encounter for procedure for purposes other than remedying health state, unspecified: Secondary | ICD-10-CM | POA: Diagnosis not present

## 2021-10-20 DIAGNOSIS — F411 Generalized anxiety disorder: Secondary | ICD-10-CM | POA: Diagnosis not present

## 2021-10-20 DIAGNOSIS — F331 Major depressive disorder, recurrent, moderate: Secondary | ICD-10-CM | POA: Diagnosis not present

## 2021-11-06 DIAGNOSIS — M79605 Pain in left leg: Secondary | ICD-10-CM | POA: Diagnosis not present

## 2021-11-06 DIAGNOSIS — R202 Paresthesia of skin: Secondary | ICD-10-CM | POA: Diagnosis not present

## 2021-11-06 DIAGNOSIS — R29898 Other symptoms and signs involving the musculoskeletal system: Secondary | ICD-10-CM | POA: Diagnosis not present

## 2021-11-06 DIAGNOSIS — R7309 Other abnormal glucose: Secondary | ICD-10-CM | POA: Diagnosis not present

## 2021-11-06 DIAGNOSIS — E538 Deficiency of other specified B group vitamins: Secondary | ICD-10-CM | POA: Diagnosis not present

## 2021-11-10 DIAGNOSIS — F411 Generalized anxiety disorder: Secondary | ICD-10-CM | POA: Diagnosis not present

## 2021-11-10 DIAGNOSIS — F331 Major depressive disorder, recurrent, moderate: Secondary | ICD-10-CM | POA: Diagnosis not present

## 2021-11-10 NOTE — Progress Notes (Deleted)
    GYNECOLOGY PROGRESS NOTE  Subjective:    Patient ID: Gabriela Anderson, female    DOB: [birth year], 21 y.o.   MRN: [id number]  HPI  Patient is a 21 y.o. G76P1001 female who presents for irregular menstrual cycles. She has had cycles  {Common ambulatory SmartLinks:19316}  Review of Systems {ros; complete:30496}   Objective:   currently breastfeeding. There is no height or weight on file to calculate BMI. General appearance: {general exam:16600} Abdomen: {abdominal exam:16834} Pelvic: {pelvic exam:16852::"cervix normal in appearance","external genitalia normal","no adnexal masses or tenderness","no cervical motion tenderness","rectovaginal septum normal","uterus normal size, shape, and consistency","vagina normal without discharge"} Extremities: {extremity exam:5109} Neurologic: {neuro exam:17854}   Assessment:   No diagnosis found.   Plan:   There are no diagnoses linked to this encounter.

## 2021-11-11 ENCOUNTER — Encounter: Payer: Medicaid Other | Admitting: Obstetrics and Gynecology

## 2021-11-17 DIAGNOSIS — Z419 Encounter for procedure for purposes other than remedying health state, unspecified: Secondary | ICD-10-CM | POA: Diagnosis not present

## 2021-11-24 NOTE — Progress Notes (Signed)
? ? ?GYNECOLOGY ANNUAL PHYSICAL EXAM PROGRESS NOTE ? ?Subjective:  ? ? Gabriela Anderson is a 21 y.o. G43P1001 female who presents for an annual exam. . The patient is sexually active. The patient participates in regular exercise: no. Has the patient ever been transfused or tattooed?: no. The patient reports that there is not domestic violence in her life.  ? ?The patient has the following complaints today: ? ?Abdominal cramping and tenderness with Nexplanon, mostly during cycles.  Does note last cycle in March lasted for 2-3 weeks.  ?Still noting urinary issues (urge incontinence) since birth of her child in 2021.  ? ? ?Menstrual History: ?Menarche age: 52 ?Patient's last menstrual period was 09/30/2021 (approximate). ?Period Pattern: (!) Irregular ?Menstrual Flow: Moderate, Heavy ?Menstrual Control: Maxi pad ?Menstrual Control Change Freq (Hours): 3-4 ?Dysmenorrhea: (!) Moderate ?Dysmenorrhea Symptoms: Cramping, Nausea ? ? ?Gynecologic History:  ?Contraception: Nexplanon ?History of STI's: Gonorrhea and chlamydia  ?Last Pap: 11/22/2020. Results were: normal.  Denies h/o abnormal pap smears. ? ? ? Upstream - 11/25/21 1425   ? ?  ? Pregnancy Intention Screening  ? Does the patient want to become pregnant in the next year? No   ? Does the patient's partner want to become pregnant in the next year? No   ? Would the patient like to discuss contraceptive options today? No   ?  ? Contraception Wrap Up  ? Current Method Hormonal Implant   ? End Method Hormonal Implant   ? Contraception Counseling Provided No   ? ?  ?  ? ?  ? ?The pregnancy intention screening data noted above was reviewed. Potential methods of contraception were discussed. The patient elected to proceed with Hormonal Implant. ? ? ?OB History  ?Gravida Para Term Preterm AB Living  ?1 1 1  0 0 1  ?SAB IAB Ectopic Multiple Live Births  ?0 0 0 0 1  ?  ?# Outcome Date GA Lbr Len/2nd Weight Sex Delivery Anes PTL Lv  ?1 Term 11/08/19 [redacted]w[redacted]d 21:52 / 00:31 7 lb 4.8  oz (3.31 kg) M Vag-Spont   LIV  ?   Name: MISSEY, MCCRIGHT  ?   Apgar1: 8  Apgar5: 9  ? ? ?Past Medical History:  ?Diagnosis Date  ? Anemia   ? Anxiety   ? Asthma   ? Depression   ? ? ?Past Surgical History:  ?Procedure Laterality Date  ? MANDIBLE SURGERY    ? MYRINGOTOMY    ? NO PAST SURGERIES    ? ? ?Family History  ?Problem Relation Age of Onset  ? Diabetes Mother   ? Diabetes Father   ? Heart disease Paternal Grandmother   ? ? ?Social History  ? ?Socioeconomic History  ? Marital status: Single  ?  Spouse name: Not on file  ? Number of children: Not on file  ? Years of education: Not on file  ? Highest education level: Not on file  ?Occupational History  ? Not on file  ?Tobacco Use  ? Smoking status: Never  ? Smokeless tobacco: Never  ?Vaping Use  ? Vaping Use: Every day  ?Substance and Sexual Activity  ? Alcohol use: No  ? Drug use: No  ? Sexual activity: Not Currently  ?  Birth control/protection: Implant  ?Other Topics Concern  ? Not on file  ?Social History Narrative  ? Not on file  ? ?Social Determinants of Health  ? ?Financial Resource Strain: Not on file  ?Food Insecurity: Not on file  ?Transportation Needs:  Not on file  ?Physical Activity: Not on file  ?Stress: Not on file  ?Social Connections: Not on file  ?Intimate Partner Violence: Not on file  ? ? ?Current Outpatient Medications on File Prior to Visit  ?Medication Sig Dispense Refill  ? albuterol (VENTOLIN HFA) 108 (90 Base) MCG/ACT inhaler Inhale 2 puffs into the lungs as needed.    ? cetirizine (ZYRTEC) 10 MG tablet Take 10 mg by mouth as needed. (Patient not taking: Reported on 08/07/2021)    ? etonogestrel (NEXPLANON) 68 MG IMPL implant 1 each by Subdermal route once.    ? loratadine (CLARITIN) 10 MG tablet Take 10 mg by mouth daily. (Patient not taking: Reported on 08/07/2021)    ? montelukast (SINGULAIR) 10 MG tablet SMARTSIG:1 Tablet(s) By Mouth Every Evening    ? Multiple Vitamin (MULTIVITAMIN ADULT PO) Take by mouth.    ? pantoprazole  (PROTONIX) 20 MG tablet Take 1 tablet (20 mg total) by mouth daily. 30 tablet 0  ? ?No current facility-administered medications on file prior to visit.  ? ? ?No Known Allergies ? ? ?Review of Systems ?Constitutional: negative for chills, fatigue, fevers and sweats ?Eyes: negative for irritation, redness and visual disturbance ?Ears, nose, mouth, throat, and face: negative for hearing loss, nasal congestion, snoring and tinnitus ?Respiratory: negative for asthma, cough, sputum ?Cardiovascular: negative for chest pain, dyspnea, exertional chest pressure/discomfort, irregular heart beat, palpitations and syncope ?Gastrointestinal: negative for abdominal pain, change in bowel habits, nausea and vomiting ?Genitourinary: negative for abnormal menstrual periods, genital lesions, sexual problems and vaginal discharge, dysuria. Does notes some mild urinary incontinence (mostly urge) ?Integument/breast: negative for breast lump, breast tenderness and nipple discharge ?Hematologic/lymphatic: negative for bleeding and easy bruising ?Musculoskeletal:negative for back pain and muscle weakness ?Neurological: negative for dizziness, headaches, vertigo and weakness ?Endocrine: negative for diabetic symptoms including polydipsia, polyuria and skin dryness ?Allergic/Immunologic: negative for hay fever and urticaria    ? ? ?Objective:  ?Blood pressure 108/80, pulse 96, height 5\' 9"  (1.753 m), weight 277 lb 12.8 oz (126 kg), last menstrual period 09/30/2021, not currently breastfeeding.  Body mass index is 41.02 kg/m?. ? ?General Appearance:    Alert, cooperative, no distress, appears stated age  ?Head:    Normocephalic, without obvious abnormality, atraumatic  ?Eyes:    PERRL, conjunctiva/corneas clear, EOM's intact, both eyes  ?Ears:    Normal external ear canals, both ears  ?Nose:   Nares normal, septum midline, mucosa normal, no drainage or sinus tenderness  ?Throat:   Lips, mucosa, and tongue normal; teeth and gums normal  ?Neck:    Supple, symmetrical, trachea midline, no adenopathy; thyroid: no enlargement/tenderness/nodules; no carotid bruit or JVD  ?Back:     Symmetric, no curvature, ROM normal, no CVA tenderness  ?Lungs:     Clear to auscultation bilaterally, respirations unlabored  ?Chest Wall:    No tenderness or deformity  ? Heart:    Regular rate and rhythm, S1 and S2 normal, no murmur, rub or gallop  ?Breast Exam:    No tenderness, masses, or nipple abnormality  ?Abdomen:     Soft, non-tender, bowel sounds active all four quadrants, no masses, no organomegaly.    ?Genitalia:    Pelvic: deferred by request today.   ?Extremities:   Extremities normal, atraumatic, no cyanosis or edema  ?Pulses:   2+ and symmetric all extremities  ?Skin:   Skin color, texture, turgor normal, no rashes or lesions  ?Lymph nodes:   Cervical, supraclavicular, and axillary nodes normal  ?  Neurologic:   CNII-XII intact, normal strength, sensation and reflexes throughout  ? ?. ? ?Labs:  ?Lab Results  ?Component Value Date  ? WBC 6.4 11/22/2020  ? HGB 12.8 11/22/2020  ? HCT 39.2 11/22/2020  ? MCV 82 11/22/2020  ? PLT 416 11/22/2020  ? ? ?Lab Results  ?Component Value Date  ? CREATININE 0.98 07/05/2020  ? BUN 9 07/05/2020  ? NA 136 07/05/2020  ? K 3.2 (L) 07/05/2020  ? CL 102 07/05/2020  ? CO2 20 (L) 07/05/2020  ? ? ?Lab Results  ?Component Value Date  ? ALT 14 07/05/2020  ? AST 22 07/05/2020  ? ALKPHOS 64 07/05/2020  ? BILITOT 0.8 07/05/2020  ? ? ?Lab Results  ?Component Value Date  ? TSH 0.989 11/22/2020  ? ? ? ?Assessment:  ? ?1. Well woman exam with routine gynecological exam   ?2. Urge incontinence of urine   ?3. Nexplanon in place   ?4. Dysmenorrhea   ?5. Class 3 severe obesity without serious comorbidity with body mass index (BMI) of 40.0 to 44.9 in adult, unspecified obesity type ([place])   ?6. Screen for STD (sexually transmitted disease)   ? ? ?Plan:  ?Blood tests: CBC with diff, Comprehensive metabolic panel, and [id number]. ?Breast self exam technique  reviewed and patient encouraged to perform self-exam monthly. ?Contraception: Nexplanon. Due for removal in 1 year.  ?Pap smear  UTD . ?COVID vaccination status: has completed Pfizer series. Eligible for booster.

## 2021-11-24 NOTE — Patient Instructions (Signed)
Breast Self-Awareness ?Breast self-awareness is knowing how your breasts look and feel. You need to: ?Check your breasts on a regular basis. ?Tell your doctor about any changes. ?Become familiar with the look and feel of your breasts. This can help you catch a breast problem while it is still small and can be treated. You should do breast self-exams even if you have breast implants. ?What you need: ?A mirror. ?A well-lit room. ?A pillow or other soft object. ?How to do a breast self-exam ?Follow these steps to do a breast self-exam: ?Look for changes ? ?Take off all the clothes above your waist. ?Stand in front of a mirror in a room with good lighting. ?Put your hands down at your sides. ?Compare your breasts in the mirror. Look for any difference between them, such as: ?A difference in shape. ?A difference in size. ?Wrinkles, dips, and bumps in one breast and not the other. ?Look at each breast for changes in the skin, such as: ?Redness. ?Scaly areas. ?Skin that has gotten thicker. ?Dimpling. ?Open sores (ulcers). ?Look for changes in your nipples, such as: ?Fluid coming out of a nipple. ?Fluid around a nipple. ?Bleeding. ?Dimpling. ?Redness. ?A nipple that looks pushed in (retracted), or that has changed position. ?Feel for changes ?Lie on your back. ?Feel each breast. To do this: ?Pick a breast to feel. ?Place a pillow under the shoulder closest to that breast. Put the arm closest to that breast behind your head. ?Feel the nipple area of that breast using the hand of your other arm. Feel the area with the pads of your three middle fingers by making small circles with your fingers. Use light, medium, and firm pressure. ?Continue the overlapping circles, moving downward over the breast. Keep making circles with your fingers. Stop when you feel your ribs. ?Start making circles with your fingers again, this time going upward until you reach your collarbone. ?Then, make circles outward across your breast and into your  armpit area. ?Squeeze your nipple. Check for discharge and lumps. ?Repeat these steps to check your other breast. ?Sit or stand in the tub or shower. ?With soapy water on your skin, feel each breast the same way you did when you were lying down. ?Write down what you find ?Writing down what you find can help you remember what to tell your doctor. Write down: ?What is normal for each breast. ?Any changes you find in each breast. These include: ?The kind of changes you find. ?A tender or painful breast. ?Any lump you find. Write down its size and where it is. ?When you last had your monthly period (menstrual cycle). ?General tips ?If you are breastfeeding, the best time to check your breasts is after you feed your baby or after you use a breast pump. ?If you get monthly bleeding, the best time to check your breasts is 5-7 days after your monthly cycle ends. ?With time, you will become comfortable with the self-exam. You will also start to know if there are changes in your breasts. ?Contact a doctor if: ?You see a change in the shape or size of your breasts or nipples. ?You see a change in the skin of your breast or nipples, such as red or scaly skin. ?You have fluid coming from your nipples that is not normal. ?You find a new lump or thick area. ?You have breast pain. ?You have any concerns about your breast health. ?Summary ?Breast self-awareness includes looking for changes in your breasts and feeling for changes   within your breasts. ?You should do breast self-awareness in front of a mirror in a well-lit room. ?If you get monthly periods (menstrual cycles), the best time to check your breasts is 5-7 days after your period ends. ?Tell your doctor about any changes you see in your breasts. Changes include changes in size, changes on the skin, painful or tender breasts, or fluid from your nipples that is not normal. ?This information is not intended to replace advice given to you by your health care provider. Make sure  you discuss any questions you have with your health care provider. ?Document Revised: 05/08/2021 Document Reviewed: 05/08/2021 ?Elsevier Patient Education ? 2023 [employer] ?Preventive Care 21-39 Years Old, Female ?Preventive care refers to lifestyle choices and visits with your health care provider that can promote health and wellness. Preventive care visits are also called wellness exams. ?What can I expect for my preventive care visit? ?Counseling ?During your preventive care visit, your health care provider may ask about your: ?Medical history, including: ?Past medical problems. ?Family medical history. ?Pregnancy history. ?Current health, including: ?Menstrual cycle. ?Method of birth control. ?Emotional well-being. ?Home life and relationship well-being. ?Sexual activity and sexual health. ?Lifestyle, including: ?Alcohol, nicotine or tobacco, and drug use. ?Access to firearms. ?Diet, exercise, and sleep habits. ?Work and work environment. ?Sunscreen use. ?Safety issues such as seatbelt and bike helmet use. ?Physical exam ?Your health care provider may check your: ?Height and weight. These may be used to calculate your BMI (body mass index). BMI is a measurement that tells if you are at a healthy weight. ?Waist circumference. This measures the distance around your waistline. This measurement also tells if you are at a healthy weight and may help predict your risk of certain diseases, such as type 2 diabetes and high blood pressure. ?Heart rate and blood pressure. ?Body temperature. ?Skin for abnormal spots. ?What immunizations do I need? ? ?Vaccines are usually given at various ages, according to a schedule. Your health care provider will recommend vaccines for you based on your age, medical history, and lifestyle or other factors, such as travel or where you work. ?What tests do I need? ?Screening ?Your health care provider may recommend screening tests for certain conditions. This may include: ?Pelvic exam  and Pap test. ?Lipid and cholesterol levels. ?Diabetes screening. This is done by checking your blood sugar (glucose) after you have not eaten for a while (fasting). ?Hepatitis B test. ?Hepatitis C test. ?HIV (human immunodeficiency virus) test. ?STI (sexually transmitted infection) testing, if you are at risk. ?BRCA-related cancer screening. This may be done if you have a family history of breast, ovarian, tubal, or peritoneal cancers. ?Talk with your health care provider about your test results, treatment options, and if necessary, the need for more tests. ?Follow these instructions at home: ?Eating and drinking ? ?Eat a healthy diet that includes fresh fruits and vegetables, whole grains, lean protein, and low-fat dairy products. ?Take vitamin and mineral supplements as recommended by your health care provider. ?Do not drink alcohol if: ?Your health care provider tells you not to drink. ?You are pregnant, may be pregnant, or are planning to become pregnant. ?If you drink alcohol: ?Limit how much you have to 0-1 drink a day. ?Know how much alcohol is in your drink. In the [place] one drink equals one 12 oz bottle of beer (355 mL), one 5 oz glass of wine (148 mL), or one 1? oz glass of hard liquor (44 mL). ?Lifestyle ?Brush your teeth every morning and   night with fluoride toothpaste. Floss one time each day. ?Exercise for at least 30 minutes 5 or more days each week. ?Do not use any products that contain nicotine or tobacco. These products include cigarettes, chewing tobacco, and vaping devices, such as e-cigarettes. If you need help quitting, ask your health care provider. ?Do not use drugs. ?If you are sexually active, practice safe sex. Use a condom or other form of protection to prevent STIs. ?If you do not wish to become pregnant, use a form of birth control. If you plan to become pregnant, see your health care provider for a prepregnancy visit. ?Find healthy ways to manage stress, such as: ?Meditation, yoga, or  listening to music. ?Journaling. ?Talking to a trusted person. ?Spending time with friends and family. ?Minimize exposure to UV radiation to reduce your risk of skin cancer. ?Safety ?Always wear you

## 2021-11-25 ENCOUNTER — Encounter: Payer: Self-pay | Admitting: Obstetrics and Gynecology

## 2021-11-25 ENCOUNTER — Ambulatory Visit (INDEPENDENT_AMBULATORY_CARE_PROVIDER_SITE_OTHER): Payer: Medicaid Other | Admitting: Obstetrics and Gynecology

## 2021-11-25 VITALS — BP 108/80 | HR 96 | Ht 69.0 in | Wt 277.8 lb

## 2021-11-25 DIAGNOSIS — Z113 Encounter for screening for infections with a predominantly sexual mode of transmission: Secondary | ICD-10-CM | POA: Diagnosis not present

## 2021-11-25 DIAGNOSIS — Z6841 Body Mass Index (BMI) 40.0 and over, adult: Secondary | ICD-10-CM | POA: Diagnosis not present

## 2021-11-25 DIAGNOSIS — Z131 Encounter for screening for diabetes mellitus: Secondary | ICD-10-CM | POA: Diagnosis not present

## 2021-11-25 DIAGNOSIS — Z975 Presence of (intrauterine) contraceptive device: Secondary | ICD-10-CM

## 2021-11-25 DIAGNOSIS — N3941 Urge incontinence: Secondary | ICD-10-CM

## 2021-11-25 DIAGNOSIS — F331 Major depressive disorder, recurrent, moderate: Secondary | ICD-10-CM | POA: Diagnosis not present

## 2021-11-25 DIAGNOSIS — Z01419 Encounter for gynecological examination (general) (routine) without abnormal findings: Secondary | ICD-10-CM

## 2021-11-25 DIAGNOSIS — F411 Generalized anxiety disorder: Secondary | ICD-10-CM | POA: Diagnosis not present

## 2021-11-25 DIAGNOSIS — N946 Dysmenorrhea, unspecified: Secondary | ICD-10-CM

## 2021-11-26 LAB — COMPREHENSIVE METABOLIC PANEL
ALT: 14 IU/L (ref 0–32)
AST: 21 IU/L (ref 0–40)
Albumin/Globulin Ratio: 1.3 (ref 1.2–2.2)
Albumin: 4.2 g/dL (ref 3.9–5.0)
Alkaline Phosphatase: 88 IU/L (ref 44–121)
BUN/Creatinine Ratio: 12 (ref 9–23)
BUN: 11 mg/dL (ref 6–20)
Bilirubin Total: <0.2 mg/dL (ref 0.0–1.2)
CO2: 21 mmol/L (ref 20–29)
Calcium: 9.5 mg/dL (ref 8.7–10.2)
Chloride: 104 mmol/L (ref 96–106)
Creatinine, Ser: 0.94 mg/dL (ref 0.57–1.00)
Globulin, Total: 3.2 g/dL (ref 1.5–4.5)
Glucose: 86 mg/dL (ref 70–99)
Potassium: 4.2 mmol/L (ref 3.5–5.2)
Sodium: 139 mmol/L (ref 134–144)
Total Protein: 7.4 g/dL (ref 6.0–8.5)
eGFR: 88 mL/min/{1.73_m2} (ref >59)

## 2021-11-26 LAB — CBC
Hematocrit: 38.6 % (ref 34.0–46.6)
Hemoglobin: 12.9 g/dL (ref 11.1–15.9)
MCH: 27.3 pg (ref 26.6–33.0)
MCHC: 33.4 g/dL (ref 31.5–35.7)
MCV: 82 fL (ref 79–97)
Platelets: 322 10*3/uL (ref 150–450)
RBC: 4.73 x10E6/uL (ref 3.77–5.28)
RDW: 13.7 % (ref 11.7–15.4)
WBC: 5.5 10*3/uL (ref 3.4–10.8)

## 2021-11-26 LAB — HEMOGLOBIN A1C
Est. average glucose Bld gHb Est-mCnc: 120 mg/dL
Hgb A1c MFr Bld: 5.8 % — ABNORMAL HIGH (ref 4.8–5.6)

## 2021-11-27 LAB — CERVICOVAGINAL ANCILLARY ONLY
Chlamydia: NEGATIVE
Comment: NEGATIVE
Comment: NORMAL
Neisseria Gonorrhea: NEGATIVE

## 2021-12-03 ENCOUNTER — Encounter: Payer: Medicaid Other | Admitting: Obstetrics and Gynecology

## 2021-12-10 DIAGNOSIS — F411 Generalized anxiety disorder: Secondary | ICD-10-CM | POA: Diagnosis not present

## 2021-12-10 DIAGNOSIS — F331 Major depressive disorder, recurrent, moderate: Secondary | ICD-10-CM | POA: Diagnosis not present

## 2021-12-18 DIAGNOSIS — Z419 Encounter for procedure for purposes other than remedying health state, unspecified: Secondary | ICD-10-CM | POA: Diagnosis not present

## 2021-12-22 DIAGNOSIS — F331 Major depressive disorder, recurrent, moderate: Secondary | ICD-10-CM | POA: Diagnosis not present

## 2021-12-22 DIAGNOSIS — F411 Generalized anxiety disorder: Secondary | ICD-10-CM | POA: Diagnosis not present

## 2022-01-05 DIAGNOSIS — F411 Generalized anxiety disorder: Secondary | ICD-10-CM | POA: Diagnosis not present

## 2022-01-05 DIAGNOSIS — F331 Major depressive disorder, recurrent, moderate: Secondary | ICD-10-CM | POA: Diagnosis not present

## 2022-01-17 DIAGNOSIS — Z419 Encounter for procedure for purposes other than remedying health state, unspecified: Secondary | ICD-10-CM | POA: Diagnosis not present

## 2022-01-19 DIAGNOSIS — F411 Generalized anxiety disorder: Secondary | ICD-10-CM | POA: Diagnosis not present

## 2022-01-19 DIAGNOSIS — F331 Major depressive disorder, recurrent, moderate: Secondary | ICD-10-CM | POA: Diagnosis not present

## 2022-02-04 DIAGNOSIS — F331 Major depressive disorder, recurrent, moderate: Secondary | ICD-10-CM | POA: Diagnosis not present

## 2022-02-04 DIAGNOSIS — F411 Generalized anxiety disorder: Secondary | ICD-10-CM | POA: Diagnosis not present

## 2022-02-17 DIAGNOSIS — Z419 Encounter for procedure for purposes other than remedying health state, unspecified: Secondary | ICD-10-CM | POA: Diagnosis not present

## 2022-02-26 NOTE — Progress Notes (Signed)
NO SHOW

## 2022-02-27 ENCOUNTER — Ambulatory Visit (INDEPENDENT_AMBULATORY_CARE_PROVIDER_SITE_OTHER): Payer: Medicaid Other | Admitting: Certified Nurse Midwife

## 2022-02-27 DIAGNOSIS — Z91199 Patient's noncompliance with other medical treatment and regimen due to unspecified reason: Secondary | ICD-10-CM

## 2022-03-02 DIAGNOSIS — F331 Major depressive disorder, recurrent, moderate: Secondary | ICD-10-CM | POA: Diagnosis not present

## 2022-03-02 DIAGNOSIS — F411 Generalized anxiety disorder: Secondary | ICD-10-CM | POA: Diagnosis not present

## 2022-03-05 ENCOUNTER — Encounter: Payer: Self-pay | Admitting: Certified Nurse Midwife

## 2022-03-05 NOTE — Progress Notes (Signed)
NO SHOW

## 2022-03-05 NOTE — Progress Notes (Signed)
Pt now showed

## 2022-03-06 ENCOUNTER — Ambulatory Visit (INDEPENDENT_AMBULATORY_CARE_PROVIDER_SITE_OTHER): Payer: Medicaid Other | Admitting: Certified Nurse Midwife

## 2022-03-06 DIAGNOSIS — R399 Unspecified symptoms and signs involving the genitourinary system: Secondary | ICD-10-CM

## 2022-03-16 DIAGNOSIS — F411 Generalized anxiety disorder: Secondary | ICD-10-CM | POA: Diagnosis not present

## 2022-03-16 DIAGNOSIS — F331 Major depressive disorder, recurrent, moderate: Secondary | ICD-10-CM | POA: Diagnosis not present

## 2022-03-20 DIAGNOSIS — Z419 Encounter for procedure for purposes other than remedying health state, unspecified: Secondary | ICD-10-CM | POA: Diagnosis not present

## 2022-03-30 DIAGNOSIS — F331 Major depressive disorder, recurrent, moderate: Secondary | ICD-10-CM | POA: Diagnosis not present

## 2022-03-30 DIAGNOSIS — F411 Generalized anxiety disorder: Secondary | ICD-10-CM | POA: Diagnosis not present

## 2022-04-13 DIAGNOSIS — F411 Generalized anxiety disorder: Secondary | ICD-10-CM | POA: Diagnosis not present

## 2022-04-13 DIAGNOSIS — F331 Major depressive disorder, recurrent, moderate: Secondary | ICD-10-CM | POA: Diagnosis not present

## 2022-04-19 DIAGNOSIS — Z419 Encounter for procedure for purposes other than remedying health state, unspecified: Secondary | ICD-10-CM | POA: Diagnosis not present

## 2022-04-20 DIAGNOSIS — Z03818 Encounter for observation for suspected exposure to other biological agents ruled out: Secondary | ICD-10-CM | POA: Diagnosis not present

## 2022-04-20 DIAGNOSIS — H6692 Otitis media, unspecified, left ear: Secondary | ICD-10-CM | POA: Diagnosis not present

## 2022-04-20 DIAGNOSIS — U071 COVID-19: Secondary | ICD-10-CM | POA: Diagnosis not present

## 2022-04-27 DIAGNOSIS — F331 Major depressive disorder, recurrent, moderate: Secondary | ICD-10-CM | POA: Diagnosis not present

## 2022-04-27 DIAGNOSIS — F411 Generalized anxiety disorder: Secondary | ICD-10-CM | POA: Diagnosis not present

## 2022-05-11 DIAGNOSIS — F331 Major depressive disorder, recurrent, moderate: Secondary | ICD-10-CM | POA: Diagnosis not present

## 2022-05-11 DIAGNOSIS — F411 Generalized anxiety disorder: Secondary | ICD-10-CM | POA: Diagnosis not present

## 2022-05-20 DIAGNOSIS — Z419 Encounter for procedure for purposes other than remedying health state, unspecified: Secondary | ICD-10-CM | POA: Diagnosis not present

## 2022-05-25 DIAGNOSIS — F411 Generalized anxiety disorder: Secondary | ICD-10-CM | POA: Diagnosis not present

## 2022-05-25 DIAGNOSIS — F331 Major depressive disorder, recurrent, moderate: Secondary | ICD-10-CM | POA: Diagnosis not present

## 2022-06-08 DIAGNOSIS — F411 Generalized anxiety disorder: Secondary | ICD-10-CM | POA: Diagnosis not present

## 2022-06-08 DIAGNOSIS — F331 Major depressive disorder, recurrent, moderate: Secondary | ICD-10-CM | POA: Diagnosis not present

## 2022-06-19 DIAGNOSIS — Z419 Encounter for procedure for purposes other than remedying health state, unspecified: Secondary | ICD-10-CM | POA: Diagnosis not present

## 2022-06-22 DIAGNOSIS — F331 Major depressive disorder, recurrent, moderate: Secondary | ICD-10-CM | POA: Diagnosis not present

## 2022-06-22 DIAGNOSIS — F411 Generalized anxiety disorder: Secondary | ICD-10-CM | POA: Diagnosis not present

## 2022-07-01 DIAGNOSIS — R110 Nausea: Secondary | ICD-10-CM | POA: Diagnosis not present

## 2022-07-01 DIAGNOSIS — B338 Other specified viral diseases: Secondary | ICD-10-CM | POA: Diagnosis not present

## 2022-07-01 DIAGNOSIS — H6692 Otitis media, unspecified, left ear: Secondary | ICD-10-CM | POA: Diagnosis not present

## 2022-07-01 DIAGNOSIS — Z03818 Encounter for observation for suspected exposure to other biological agents ruled out: Secondary | ICD-10-CM | POA: Diagnosis not present

## 2022-07-08 DIAGNOSIS — F331 Major depressive disorder, recurrent, moderate: Secondary | ICD-10-CM | POA: Diagnosis not present

## 2022-07-08 DIAGNOSIS — F411 Generalized anxiety disorder: Secondary | ICD-10-CM | POA: Diagnosis not present

## 2022-07-14 DIAGNOSIS — Z0000 Encounter for general adult medical examination without abnormal findings: Secondary | ICD-10-CM | POA: Diagnosis not present

## 2022-07-14 DIAGNOSIS — R7303 Prediabetes: Secondary | ICD-10-CM | POA: Diagnosis not present

## 2022-07-14 DIAGNOSIS — Z118 Encounter for screening for other infectious and parasitic diseases: Secondary | ICD-10-CM | POA: Diagnosis not present

## 2022-07-14 DIAGNOSIS — B338 Other specified viral diseases: Secondary | ICD-10-CM | POA: Diagnosis not present

## 2022-07-14 DIAGNOSIS — J45998 Other asthma: Secondary | ICD-10-CM | POA: Diagnosis not present

## 2022-07-20 DIAGNOSIS — Z419 Encounter for procedure for purposes other than remedying health state, unspecified: Secondary | ICD-10-CM | POA: Diagnosis not present

## 2022-08-03 DIAGNOSIS — F411 Generalized anxiety disorder: Secondary | ICD-10-CM | POA: Diagnosis not present

## 2022-08-03 DIAGNOSIS — F331 Major depressive disorder, recurrent, moderate: Secondary | ICD-10-CM | POA: Diagnosis not present

## 2022-08-17 DIAGNOSIS — R399 Unspecified symptoms and signs involving the genitourinary system: Secondary | ICD-10-CM | POA: Diagnosis not present

## 2022-08-17 DIAGNOSIS — R309 Painful micturition, unspecified: Secondary | ICD-10-CM | POA: Diagnosis not present

## 2022-08-17 DIAGNOSIS — R8290 Unspecified abnormal findings in urine: Secondary | ICD-10-CM | POA: Diagnosis not present

## 2022-08-17 DIAGNOSIS — F411 Generalized anxiety disorder: Secondary | ICD-10-CM | POA: Diagnosis not present

## 2022-08-17 DIAGNOSIS — F331 Major depressive disorder, recurrent, moderate: Secondary | ICD-10-CM | POA: Diagnosis not present

## 2022-08-17 DIAGNOSIS — R319 Hematuria, unspecified: Secondary | ICD-10-CM | POA: Diagnosis not present

## 2022-08-20 DIAGNOSIS — Z419 Encounter for procedure for purposes other than remedying health state, unspecified: Secondary | ICD-10-CM | POA: Diagnosis not present

## 2022-08-26 ENCOUNTER — Other Ambulatory Visit: Payer: Self-pay

## 2022-08-26 ENCOUNTER — Ambulatory Visit
Admission: RE | Admit: 2022-08-26 | Discharge: 2022-08-26 | Disposition: A | Discharge status: Home / Self Care | Payer: Medicaid Other | Source: Ambulatory Visit | Attending: Internal Medicine | Admitting: Internal Medicine

## 2022-08-26 DIAGNOSIS — R1084 Generalized abdominal pain: Secondary | ICD-10-CM | POA: Diagnosis not present

## 2022-08-26 DIAGNOSIS — R110 Nausea: Secondary | ICD-10-CM | POA: Diagnosis not present

## 2022-08-26 DIAGNOSIS — R109 Unspecified abdominal pain: Secondary | ICD-10-CM | POA: Diagnosis not present

## 2022-08-26 DIAGNOSIS — N10 Acute tubulo-interstitial nephritis: Secondary | ICD-10-CM | POA: Diagnosis not present

## 2022-08-26 DIAGNOSIS — J45998 Other asthma: Secondary | ICD-10-CM | POA: Diagnosis not present

## 2022-08-26 MED ORDER — IOHEXOL 300 MG/ML  SOLN
100.0000 mL | Freq: Once | INTRAMUSCULAR | Status: AC | PRN
  Administered 2022-08-26: 100 mL via INTRAVENOUS

## 2022-09-14 DIAGNOSIS — F411 Generalized anxiety disorder: Secondary | ICD-10-CM | POA: Diagnosis not present

## 2022-09-14 DIAGNOSIS — F331 Major depressive disorder, recurrent, moderate: Secondary | ICD-10-CM | POA: Diagnosis not present

## 2022-09-18 DIAGNOSIS — Z419 Encounter for procedure for purposes other than remedying health state, unspecified: Secondary | ICD-10-CM | POA: Diagnosis not present

## 2022-09-28 DIAGNOSIS — F411 Generalized anxiety disorder: Secondary | ICD-10-CM | POA: Diagnosis not present

## 2022-09-28 DIAGNOSIS — F331 Major depressive disorder, recurrent, moderate: Secondary | ICD-10-CM | POA: Diagnosis not present

## 2022-10-12 DIAGNOSIS — F331 Major depressive disorder, recurrent, moderate: Secondary | ICD-10-CM | POA: Diagnosis not present

## 2022-10-12 DIAGNOSIS — F411 Generalized anxiety disorder: Secondary | ICD-10-CM | POA: Diagnosis not present

## 2022-10-19 DIAGNOSIS — Z419 Encounter for procedure for purposes other than remedying health state, unspecified: Secondary | ICD-10-CM | POA: Diagnosis not present

## 2022-10-26 DIAGNOSIS — F331 Major depressive disorder, recurrent, moderate: Secondary | ICD-10-CM | POA: Diagnosis not present

## 2022-10-26 DIAGNOSIS — F411 Generalized anxiety disorder: Secondary | ICD-10-CM | POA: Diagnosis not present

## 2022-11-04 ENCOUNTER — Encounter: Payer: Self-pay | Admitting: Obstetrics and Gynecology

## 2022-11-09 DIAGNOSIS — F411 Generalized anxiety disorder: Secondary | ICD-10-CM | POA: Diagnosis not present

## 2022-11-09 DIAGNOSIS — F331 Major depressive disorder, recurrent, moderate: Secondary | ICD-10-CM | POA: Diagnosis not present

## 2022-11-18 DIAGNOSIS — Z419 Encounter for procedure for purposes other than remedying health state, unspecified: Secondary | ICD-10-CM | POA: Diagnosis not present

## 2022-11-23 DIAGNOSIS — F331 Major depressive disorder, recurrent, moderate: Secondary | ICD-10-CM | POA: Diagnosis not present

## 2022-11-23 DIAGNOSIS — F411 Generalized anxiety disorder: Secondary | ICD-10-CM | POA: Diagnosis not present

## 2022-12-03 ENCOUNTER — Encounter: Payer: Self-pay | Admitting: Obstetrics and Gynecology

## 2022-12-04 ENCOUNTER — Ambulatory Visit (INDEPENDENT_AMBULATORY_CARE_PROVIDER_SITE_OTHER): Payer: Managed Care, Other (non HMO) | Admitting: Obstetrics and Gynecology

## 2022-12-04 ENCOUNTER — Encounter: Payer: Self-pay | Admitting: Obstetrics and Gynecology

## 2022-12-04 ENCOUNTER — Other Ambulatory Visit (HOSPITAL_COMMUNITY)
Admission: RE | Admit: 2022-12-04 | Discharge: 2022-12-04 | Disposition: A | Discharge status: Home / Self Care | Payer: Managed Care, Other (non HMO) | Source: Ambulatory Visit | Attending: Obstetrics and Gynecology | Admitting: Obstetrics and Gynecology

## 2022-12-04 VITALS — BP 106/75 | HR 77 | Ht 69.0 in | Wt 253.6 lb

## 2022-12-04 DIAGNOSIS — N760 Acute vaginitis: Secondary | ICD-10-CM | POA: Insufficient documentation

## 2022-12-04 DIAGNOSIS — B9689 Other specified bacterial agents as the cause of diseases classified elsewhere: Secondary | ICD-10-CM | POA: Diagnosis not present

## 2022-12-04 DIAGNOSIS — Z113 Encounter for screening for infections with a predominantly sexual mode of transmission: Secondary | ICD-10-CM

## 2022-12-04 DIAGNOSIS — Z01419 Encounter for gynecological examination (general) (routine) without abnormal findings: Secondary | ICD-10-CM | POA: Insufficient documentation

## 2022-12-04 DIAGNOSIS — Z124 Encounter for screening for malignant neoplasm of cervix: Secondary | ICD-10-CM

## 2022-12-04 DIAGNOSIS — N926 Irregular menstruation, unspecified: Secondary | ICD-10-CM

## 2022-12-04 DIAGNOSIS — Z1159 Encounter for screening for other viral diseases: Secondary | ICD-10-CM

## 2022-12-04 DIAGNOSIS — Z975 Presence of (intrauterine) contraceptive device: Secondary | ICD-10-CM

## 2022-12-04 NOTE — Progress Notes (Signed)
GYNECOLOGY ANNUAL PHYSICAL EXAM PROGRESS NOTE  Subjective:    Gabriela Anderson is a 22 y.o. G40P1001 female who presents for an annual exam. The patient is sexually active. The patient participates in regular exercise: yes. Has the patient ever been transfused or tattooed?: no. The patient reports that there is not domestic violence in her life.   The patient has the following complaints:  Reports absent menses this past month. Has Nexplanon in place, due for removal which is scheduled for next week. Notes that her cycles were still fairly regular despite Nexplanon in place. Took a UPT last week at her job which was negative.    Menstrual History: Menarche age: 45 No LMP recorded. Period Cycle (Days): 28 Period Duration (Days): 5 Period Pattern: Regular Menstrual Flow: Moderate Menstrual Control: Maxi pad Menstrual Control Change Freq (Hours): 3-4 Dysmenorrhea: (!) Mild Dysmenorrhea Symptoms: Cramping   Gynecologic History:  Contraception: Nexplanon History of STI's: Gonorrhea and chlamydia  Last Pap: 11/22/2020. Results were: normal.  Denies h/o abnormal pap smears.    Upstream - 12/04/22 1324       Pregnancy Intention Screening   Does the patient want to become pregnant in the next year? Unsure    Does the patient's partner want to become pregnant in the next year? Unsure    Would the patient like to discuss contraceptive options today? Yes      Contraception Wrap Up   Current Method Hormonal Implant              OB History  Gravida Para Term Preterm AB Living  1 1 1  0 0 1  SAB IAB Ectopic Multiple Live Births  0 0 0 0 1    # Outcome Date GA Lbr Len/2nd Weight Sex Delivery Anes PTL Lv  1 Term 11/08/19 [redacted]w[redacted]d 21:52 / 00:31 7 lb 4.8 oz (3.31 kg) M Vag-Spont   LIV     Name: Kaczmarczyk,BOY Dezzie     Apgar1: 8  Apgar5: 9    Past Medical History:  Diagnosis Date   Anemia    Anxiety    Asthma    Depression     Past Surgical History:  Procedure  Laterality Date   MANDIBLE SURGERY     MYRINGOTOMY      Family History  Problem Relation Age of Onset   Diabetes Mother    Diabetes Father    Heart disease Paternal Grandmother     Social History   Socioeconomic History   Marital status: Single    Spouse name: Not on file   Number of children: Not on file   Years of education: Not on file   Highest education level: Not on file  Occupational History   Not on file  Tobacco Use   Smoking status: Never   Smokeless tobacco: Never  Vaping Use   Vaping Use: Every day  Substance and Sexual Activity   Alcohol use: No   Drug use: No   Sexual activity: Not Currently    Birth control/protection: Implant  Other Topics Concern   Not on file  Social History Narrative   Not on file   Social Determinants of Health   Financial Resource Strain: Not on file  Food Insecurity: Not on file  Transportation Needs: Not on file  Physical Activity: Not on file  Stress: Not on file  Social Connections: Not on file  Intimate Partner Violence: Not on file    Current Outpatient Medications on File Prior to  Visit  Medication Sig Dispense Refill   albuterol (VENTOLIN HFA) 108 (90 Base) MCG/ACT inhaler Inhale 2 puffs into the lungs as needed.     cetirizine (ZYRTEC) 10 MG tablet Take 10 mg by mouth as needed.     etonogestrel (NEXPLANON) 68 MG IMPL implant 1 each by Subdermal route once.     loratadine (CLARITIN) 10 MG tablet Take 10 mg by mouth daily.     montelukast (SINGULAIR) 10 MG tablet SMARTSIG:1 Tablet(s) By Mouth Every Evening     Multiple Vitamin (MULTIVITAMIN ADULT PO) Take by mouth.     pantoprazole (PROTONIX) 20 MG tablet Take 1 tablet (20 mg total) by mouth daily. 30 tablet 0   No current facility-administered medications on file prior to visit.    No Known Allergies   Review of Systems Constitutional: negative for chills, fatigue, fevers and sweats Eyes: negative for irritation, redness and visual disturbance Ears,  nose, mouth, throat, and face: negative for hearing loss, nasal congestion, snoring and tinnitus Respiratory: negative for asthma, cough, sputum Cardiovascular: negative for chest pain, dyspnea, exertional chest pressure/discomfort, irregular heart beat, palpitations and syncope Gastrointestinal: negative for abdominal pain, change in bowel habits, nausea and vomiting Genitourinary: negative for genital lesions, sexual problems and vaginal discharge, dysuria and urinary incontinence Integument/breast: negative for breast lump, breast tenderness and nipple discharge Hematologic/lymphatic: negative for bleeding and easy bruising Musculoskeletal:negative for back pain and muscle weakness Neurological: negative for dizziness, headaches, vertigo and weakness Endocrine: negative for diabetic symptoms including polydipsia, polyuria and skin dryness Allergic/Immunologic: negative for hay fever and urticaria      Objective:  Blood pressure 106/75, pulse 77, height 5\' 9"  (1.753 m), weight 253 lb 9.6 oz (115 kg). Body mass index is 37.45 kg/m.    General Appearance:    Alert, cooperative, no distress, appears stated age  Head:    Normocephalic, without obvious abnormality, atraumatic  Eyes:    PERRL, conjunctiva/corneas clear, EOM's intact, both eyes  Ears:    Normal external ear canals, both ears  Nose:   Nares normal, septum midline, mucosa normal, no drainage or sinus tenderness  Throat:   Lips, mucosa, and tongue normal; teeth and gums normal  Neck:   Supple, symmetrical, trachea midline, no adenopathy; thyroid: no enlargement/tenderness/nodules; no carotid bruit or JVD  Back:     Symmetric, no curvature, ROM normal, no CVA tenderness  Lungs:     Clear to auscultation bilaterally, respirations unlabored  Chest Wall:    No tenderness or deformity   Heart:    Regular rate and rhythm, S1 and S2 normal, no murmur, rub or gallop  Breast Exam:    No tenderness, masses, or nipple abnormality   Abdomen:     Soft, non-tender, bowel sounds active all four quadrants, no masses, no organomegaly.    Genitalia:    Pelvic:external genitalia normal, vagina without lesions, discharge, or tenderness, rectovaginal septum  normal. Cervix normal in appearance, no cervical motion tenderness, no adnexal masses or tenderness.  Uterus normal size, shape, mobile, regular contours, nontender.  Rectal:    Normal external sphincter.  No hemorrhoids appreciated. Internal exam not done.   Extremities:   Extremities normal, atraumatic, no cyanosis or edema.   Pulses:   2+ and symmetric all extremities  Skin:   Skin color, texture, turgor normal, no rashes or lesions  Lymph nodes:   Cervical, supraclavicular, and axillary nodes normal  Neurologic:   CNII-XII intact, normal strength, sensation and reflexes throughout   .  Labs:  Lab Results  Component Value Date   WBC 5.5 11/25/2021   HGB 12.9 11/25/2021   HCT 38.6 11/25/2021   MCV 82 11/25/2021   PLT 322 11/25/2021    Lab Results  Component Value Date   CREATININE 0.94 11/25/2021   BUN 11 11/25/2021   NA 139 11/25/2021   K 4.2 11/25/2021   CL 104 11/25/2021   CO2 21 11/25/2021    Lab Results  Component Value Date   ALT 14 11/25/2021   AST 21 11/25/2021   ALKPHOS 88 11/25/2021   BILITOT <0.2 11/25/2021    Lab Results  Component Value Date   TSH 0.989 11/22/2020      Assessment:   1. Encounter for well woman exam with routine gynecological exam   2. Need for hepatitis C screening test   3. Screen for STD (sexually transmitted disease)   4. Nexplanon in place   5. Missed menses      Plan:  Blood tests: CBC with diff and Comprehensive metabolic panel. Hep C screening performed.  Breast self exam technique reviewed and patient encouraged to perform self-exam monthly. Contraception: Nexplanon. Due for removal, has appointment in 1 week.  Discussed healthy lifestyle modifications. Pap smear  due in 1 year . Absent menses with  recent negative UPT.  Follow up in 1 year for annual exam   Hildred Laser, MD [facility]

## 2022-12-05 LAB — COMPREHENSIVE METABOLIC PANEL
ALT: 10 IU/L (ref 0–32)
AST: 17 IU/L (ref 0–40)
Albumin/Globulin Ratio: 1.6 (ref 1.2–2.2)
Albumin: 4.2 g/dL (ref 4.0–5.0)
Alkaline Phosphatase: 69 IU/L (ref 44–121)
BUN/Creatinine Ratio: 15 (ref 9–23)
BUN: 12 mg/dL (ref 6–20)
Bilirubin Total: 0.4 mg/dL (ref 0.0–1.2)
CO2: 22 mmol/L (ref 20–29)
Calcium: 8.9 mg/dL (ref 8.7–10.2)
Chloride: 105 mmol/L (ref 96–106)
Creatinine, Ser: 0.82 mg/dL (ref 0.57–1.00)
Globulin, Total: 2.6 g/dL (ref 1.5–4.5)
Glucose: 86 mg/dL (ref 70–99)
Potassium: 4.2 mmol/L (ref 3.5–5.2)
Sodium: 139 mmol/L (ref 134–144)
Total Protein: 6.8 g/dL (ref 6.0–8.5)
eGFR: 103 mL/min/{1.73_m2} (ref >59)

## 2022-12-05 LAB — CBC
Hematocrit: 37.3 % (ref 34.0–46.6)
Hemoglobin: 12.2 g/dL (ref 11.1–15.9)
MCH: 28 pg (ref 26.6–33.0)
MCHC: 32.7 g/dL (ref 31.5–35.7)
MCV: 86 fL (ref 79–97)
Platelets: 317 10*3/uL (ref 150–450)
RBC: 4.36 x10E6/uL (ref 3.77–5.28)
RDW: 13.2 % (ref 11.7–15.4)
WBC: 6.1 10*3/uL (ref 3.4–10.8)

## 2022-12-08 LAB — CERVICOVAGINAL ANCILLARY ONLY
Bacterial Vaginitis (gardnerella): POSITIVE — AB
Candida Glabrata: NEGATIVE
Candida Vaginitis: NEGATIVE
Chlamydia: NEGATIVE
Comment: NEGATIVE
Comment: NEGATIVE
Comment: NEGATIVE
Comment: NEGATIVE
Comment: NEGATIVE
Comment: NORMAL
Neisseria Gonorrhea: NEGATIVE
Trichomonas: NEGATIVE

## 2022-12-09 ENCOUNTER — Encounter: Payer: Self-pay | Admitting: Obstetrics and Gynecology

## 2022-12-09 MED ORDER — METRONIDAZOLE 500 MG PO TABS: 500.0000 mg | ORAL_TABLET | Freq: Two times a day (BID) | ORAL | 0 refills | Status: AC

## 2022-12-10 ENCOUNTER — Encounter: Payer: Self-pay | Admitting: Obstetrics and Gynecology

## 2022-12-10 ENCOUNTER — Ambulatory Visit (INDEPENDENT_AMBULATORY_CARE_PROVIDER_SITE_OTHER): Payer: Managed Care, Other (non HMO) | Admitting: Obstetrics and Gynecology

## 2022-12-10 VITALS — BP 107/72 | HR 82 | Ht 69.0 in | Wt 253.7 lb

## 2022-12-10 DIAGNOSIS — Z3046 Encounter for surveillance of implantable subdermal contraceptive: Secondary | ICD-10-CM | POA: Diagnosis not present

## 2022-12-10 NOTE — Progress Notes (Signed)
      GYNECOLOGY OFFICE PROCEDURE NOTE  Gabriela Anderson is a 22 y.o. G1P1001 here for Nexplanon removal.  Last pap smear was on 11/22/2020 and was normal.  No other gynecologic concerns. Desires conception within the year and so desires removal.    Nexplanon Removal Patient identified, informed consent performed, consent signed.   Appropriate time out taken. Nexplanon site identified.  Area prepped in usual sterile fashon. One ml of 1% lidocaine was used to anesthetize the area at the distal end of the implant. A small stab incision was made right beside the implant on the distal portion.  The Nexplanon rod was grasped using hemostats and removed without difficulty.  There was minimal blood loss. There were no complications.  3 ml of 1% lidocaine was injected around the incision for post-procedure analgesia.  Steri-strips were applied over the small incision.  A pressure bandage was applied to reduce any bruising.  The patient tolerated the procedure well and was given post procedure instructions.  Patient is planning to attempt conception.   Hildred Laser, MD [facility] at [facility]

## 2022-12-10 NOTE — Patient Instructions (Signed)
NEXPLANON REMOVAL POST-PROCEDURE INSTRUCTIONS  You may take Ibuprofen, Aleve or Tylenol for pain if needed.  Pain should resolve within in 24 hours.  You need to call if you have any fever, heavy bleeding, or redness at insertion site. Irregular bleeding is common the first month or two after having a Nexplanon removed. You do not need to call for this reason unless you are concerned.  Shower or bathe as normal.  You can remove the bandage after 24 hours.

## 2022-12-19 DIAGNOSIS — Z419 Encounter for procedure for purposes other than remedying health state, unspecified: Secondary | ICD-10-CM | POA: Diagnosis not present

## 2023-01-18 DIAGNOSIS — Z419 Encounter for procedure for purposes other than remedying health state, unspecified: Secondary | ICD-10-CM | POA: Diagnosis not present

## 2023-01-19 DIAGNOSIS — R102 Pelvic and perineal pain: Secondary | ICD-10-CM | POA: Diagnosis not present

## 2023-01-19 DIAGNOSIS — R1031 Right lower quadrant pain: Secondary | ICD-10-CM | POA: Diagnosis not present

## 2023-01-27 DIAGNOSIS — L0390 Cellulitis, unspecified: Secondary | ICD-10-CM | POA: Diagnosis not present

## 2023-02-11 ENCOUNTER — Encounter: Payer: Self-pay | Admitting: Family Medicine

## 2023-02-11 ENCOUNTER — Ambulatory Visit: Payer: Managed Care, Other (non HMO) | Admitting: Family Medicine

## 2023-02-11 DIAGNOSIS — Z113 Encounter for screening for infections with a predominantly sexual mode of transmission: Secondary | ICD-10-CM

## 2023-02-11 LAB — WET PREP FOR TRICH, YEAST, CLUE
Trichomonas Exam: NEGATIVE
Yeast Exam: NEGATIVE

## 2023-02-11 LAB — HM HIV SCREENING LAB: HM HIV Screening: NEGATIVE

## 2023-02-11 NOTE — Progress Notes (Signed)
Pt is here for STD visit.  Wet prep results reviewed with pt, no treatment required per standing order.  Condoms declined.  Gaspar Garbe, RN

## 2023-02-11 NOTE — Progress Notes (Addendum)
[facility] Department  STI clinic/screening visit [address] [place] [address] [phone]  Subjective:  Gabriela Anderson is a 22 y.o. female being seen today for an STI screening visit. The patient reports they do not have symptoms.  Patient reports that they do desire a pregnancy in the next year.   They reported they are not interested in discussing contraception today.    No LMP recorded.  Patient has the following medical conditions:   Patient Active Problem List   Diagnosis Date Noted   Alpha thalassemia silent carrier 11/07/2019    Chief Complaint  Patient presents with   SEXUALLY TRANSMITTED DISEASE    HPI  Patient reports  to clinic for STI testing. [facility] syphilis [facility] the [facility]. Confirmatory testing today  Does the patient using douching products? N/A- practitioner oversight- forgot to ask   Last HIV test per patient/review of record was No results found for: "HMHIVSCREEN"  Lab Results  Component Value Date   HIV Non Reactive 10/13/2019   Patient reports last pap was  Lab Results  Component Value Date   DIAGPAP  11/22/2020    - Negative for intraepithelial lesion or malignancy (NILM)   No results found for: "SPECADGYN"  Screening for MPX risk: Does the patient have an unexplained rash? No Is the patient MSM? No Does the patient endorse multiple sex partners or anonymous sex partners? No Did the patient have close or sexual contact with a person diagnosed with MPX? No Has the patient traveled outside the [place] where MPX is endemic? No Is there a high clinical suspicion for MPX-- evidenced by one of the following No  -Unlikely to be chickenpox  -Lymphadenopathy  -Rash that present in same phase of evolution on any given body part See flowsheet for further details and programmatic requirements.   Immunization history:  Immunization History  Administered Date(s) Administered   Influenza,inj,Quad PF,6+ Mos 04/27/2019    PFIZER(Purple Top)SARS-COV-2 Vaccination 03/23/2020, 04/13/2020   Tdap 08/23/2019     The following portions of the patient's history were reviewed and updated as appropriate: allergies, current medications, past medical history, past social history, past surgical history and problem list.  Objective:  There were no vitals filed for this visit.  Physical Exam Vitals and nursing note reviewed.  Constitutional:      Appearance: Normal appearance.  HENT:     Head: Normocephalic and atraumatic.     Mouth/Throat:     Mouth: Mucous membranes are moist.     Pharynx: Oropharynx is clear. No oropharyngeal exudate or posterior oropharyngeal erythema.  Pulmonary:     Effort: Pulmonary effort is normal.  Abdominal:     General: Abdomen is flat.     Palpations: There is no mass.     Tenderness: There is no abdominal tenderness. There is no rebound.  Genitourinary:    General: Normal vulva.     Exam position: Lithotomy position.     Pubic Area: No rash or pubic lice.      Labia:        Right: No rash or lesion.        Left: No rash or lesion.      Vagina: Vaginal discharge present. No erythema, bleeding or lesions.     Cervix: No cervical motion tenderness, discharge, friability, lesion or erythema.     Uterus: Normal.      Adnexa: Right adnexa normal and left adnexa normal.     Rectum: Normal.  Comments: pH = 4  Mild amt of white discharge present Lymphadenopathy:     Head:     Right side of head: No preauricular or posterior auricular adenopathy.     Left side of head: No preauricular or posterior auricular adenopathy.     Cervical: No cervical adenopathy.     Upper Body:     Right upper body: No supraclavicular, axillary or epitrochlear adenopathy.     Left upper body: No supraclavicular, axillary or epitrochlear adenopathy.     Lower Body: No right inguinal adenopathy. No left inguinal adenopathy.  Skin:    General: Skin is warm and dry.     Findings: No rash.   Neurological:     Mental Status: She is alert and oriented to person, place, and time.      Assessment and Plan:  Gabriela Anderson is a 22 y.o. female presenting to the [facility] Department for STI screening  1. Screening for venereal disease -positive syphilis testing at [facility] 02/03/23 -confirmatory testing today -previous records of NR on 04/06/19, 08/23/19, 10/13/19, 11/07/19  - Chlamydia/Gonorrhea [facility] - HIV [place] [facility] - Syphilis Serology, [facility] - WET PREP FOR TRICH, YEAST, CLUE   Patient accepted all screenings including vaginal CT/GC and bloodwork for HIV/RPR, and wet prep. Patient meets criteria for HepB screening? No. Ordered? not applicable Patient meets criteria for HepC screening? No. Ordered? not applicable  Treat wet prep per standing order Discussed time line for [facility] results and that patient will be called with positive results and encouraged patient to call if she had not heard in 2 weeks.  Counseled to return or seek care for continued or worsening symptoms Recommended repeat testing in 3 months with positive results. Recommended condom use with all sex  Patient is currently using  desires pregnancy  to prevent pregnancy.    Return if symptoms worsen or fail to improve, for STI screening.  No future appointments. Total time spent 15 minutes   Lenice Llamas, [place]

## 2023-02-15 DIAGNOSIS — R0981 Nasal congestion: Secondary | ICD-10-CM | POA: Diagnosis not present

## 2023-02-15 DIAGNOSIS — J029 Acute pharyngitis, unspecified: Secondary | ICD-10-CM | POA: Diagnosis not present

## 2023-02-15 DIAGNOSIS — Z03818 Encounter for observation for suspected exposure to other biological agents ruled out: Secondary | ICD-10-CM | POA: Diagnosis not present

## 2023-02-15 DIAGNOSIS — J40 Bronchitis, not specified as acute or chronic: Secondary | ICD-10-CM | POA: Diagnosis not present

## 2023-02-18 DIAGNOSIS — Z419 Encounter for procedure for purposes other than remedying health state, unspecified: Secondary | ICD-10-CM | POA: Diagnosis not present

## 2023-02-23 DIAGNOSIS — R102 Pelvic and perineal pain: Secondary | ICD-10-CM | POA: Diagnosis not present

## 2023-03-21 DIAGNOSIS — Z419 Encounter for procedure for purposes other than remedying health state, unspecified: Secondary | ICD-10-CM | POA: Diagnosis not present

## 2023-04-20 DIAGNOSIS — Z419 Encounter for procedure for purposes other than remedying health state, unspecified: Secondary | ICD-10-CM | POA: Diagnosis not present

## 2023-05-21 DIAGNOSIS — Z419 Encounter for procedure for purposes other than remedying health state, unspecified: Secondary | ICD-10-CM | POA: Diagnosis not present

## 2023-06-20 DIAGNOSIS — Z419 Encounter for procedure for purposes other than remedying health state, unspecified: Secondary | ICD-10-CM | POA: Diagnosis not present

## 2023-07-21 DIAGNOSIS — Z419 Encounter for procedure for purposes other than remedying health state, unspecified: Secondary | ICD-10-CM | POA: Diagnosis not present

## 2023-08-21 DIAGNOSIS — Z419 Encounter for procedure for purposes other than remedying health state, unspecified: Secondary | ICD-10-CM | POA: Diagnosis not present

## 2023-09-18 DIAGNOSIS — Z419 Encounter for procedure for purposes other than remedying health state, unspecified: Secondary | ICD-10-CM | POA: Diagnosis not present

## 2023-10-30 DIAGNOSIS — Z419 Encounter for procedure for purposes other than remedying health state, unspecified: Secondary | ICD-10-CM | POA: Diagnosis not present

## 2023-11-29 DIAGNOSIS — Z419 Encounter for procedure for purposes other than remedying health state, unspecified: Secondary | ICD-10-CM | POA: Diagnosis not present

## 2023-12-15 SURGERY — Surgical Case
Anesthesia: *Unknown

## 2023-12-27 NOTE — Progress Notes (Unsigned)
 GYNECOLOGY ANNUAL PHYSICAL EXAM NOTE  Subjective:    Anderson Anderson is a 23 y.o. G14P1001 female who presents for an annual exam.  The patient {is/is not/has never been:13135} sexually active. The patient participates in regular exercise: {yes/no/not asked:9010}. Has the patient ever been transfused or tattooed?: {yes/no/not asked:9010}. The patient reports that there {is/is not:9024} domestic violence in her life.   The patient has the following complaints today: Wants to get pregnant and has not been successful.  Menstrual History: Menarche age: *** No LMP recorded.     Gynecologic History:  Contraception: {method:5051} History of STI's:  Last Pap: 11/22/20. Results were: normal.  ***Denies/Notes h/o abnormal pap smears. Last mammogram: never had due to age       15 History  2 Para Term Preterm AB Living  1 1 1  0 0 1  SAB IAB Ectopic Multiple Live Births  0 0 0 0 1    # Outcome Date GA Lbr Len/2nd Weight Sex Type Anes PTL Lv  1 Term 11/08/19 [redacted]w[redacted]d 21:52 / 00:31 7 lb 4.8 oz (3.31 kg) M Vag-Spont   LIV     Name: Anderson Anderson     Apgar1: 8  Apgar5: 9    Past Medical History:  Diagnosis Date   Anemia    Anxiety    Asthma    Depression     Past Surgical History:  Procedure Laterality Date   MANDIBLE SURGERY     MYRINGOTOMY      Family History  Problem Relation Age of Onset   Diabetes Mother    Diabetes Father    Heart disease Paternal Grandmother     Social History   Socioeconomic History   Marital status: Single    Spouse name: Not on file   Number of children: Not on file   Years of education: Not on file   Highest education level: Not on file  Occupational History   Not on file  Tobacco Use   Smoking status: Never   Smokeless tobacco: Never  Vaping Use   Vaping status: Every Day  Substance and Sexual Activity   Alcohol use: No   Drug use: No   Sexual activity: Not Currently    Birth control/protection: Implant  Other  Topics Concern   Not on file  Social History Narrative   Not on file   Social Drivers of Health   Financial Resource Strain: Low Risk  (12/25/2020)   Received [facility], [facility]   Overall Financial Resource Strain (CARDIA)    Difficulty of Paying Living Expenses: Not very hard  Food Insecurity: No Food Insecurity (12/25/2020)   Received from [facility], [facility]   Hunger Vital Sign    Worried About Running Out of Food in the Last Year: Never true    Ran Out of Food in the Last Year: Never true  Transportation Needs: No Transportation Needs (12/25/2020)   Received from [facility], [facility]   PRAPARE - Transportation    Lack of Transportation (Medical): No    Lack of Transportation (Non-Medical): No  Physical Activity: Not on file  Stress: Not on file  Social Connections: Not on file  Intimate Partner Violence: Not on file    Current Outpatient Medications on File Prior to Visit  Medication Sig Dispense Refill   albuterol  (VENTOLIN  HFA) 108 (90 Base) MCG/ACT inhaler Inhale 2 puffs into the lungs as needed. (Patient not taking: Reported on  02/11/2023)     cetirizine (ZYRTEC) 10 MG tablet Take 10 mg by mouth as needed. (Patient not taking: Reported on 02/11/2023)     loratadine (CLARITIN) 10 MG tablet Take 10 mg by mouth daily. (Patient not taking: Reported on 02/11/2023)     metroNIDAZOLE  (FLAGYL ) 500 MG tablet Take 1 tablet (500 mg total) by mouth 2 (two) times daily. 14 tablet 0   montelukast (SINGULAIR) 10 MG tablet SMARTSIG:1 Tablet(s) By Mouth Every Evening (Patient not taking: Reported on 02/11/2023)     Multiple Vitamin (MULTIVITAMIN ADULT PO) Take by mouth. (Patient not taking: Reported on 02/11/2023)     pantoprazole  (PROTONIX ) 20 MG tablet Take 1 tablet (20 mg total) by mouth daily. (Patient not taking: Reported on 02/11/2023) 30 tablet 0   No current facility-administered medications on file prior to visit.    No Known  Allergies   Review of Systems Constitutional: negative for chills, fatigue, fevers and sweats Eyes: negative for irritation, redness and visual disturbance Ears, nose, mouth, throat, and face: negative for hearing loss, nasal congestion, snoring and tinnitus Respiratory: negative for asthma, cough, sputum Cardiovascular: negative for chest pain, dyspnea, exertional chest pressure/discomfort, irregular heart beat, palpitations and syncope Gastrointestinal: negative for abdominal pain, change in bowel habits, nausea and vomiting Genitourinary: negative for abnormal menstrual periods, genital lesions, sexual problems and vaginal discharge, dysuria and urinary incontinence Integument/breast: negative for breast lump, breast tenderness and nipple discharge Hematologic/lymphatic: negative for bleeding and easy bruising Musculoskeletal:negative for back pain and muscle weakness Neurological: negative for dizziness, headaches, vertigo and weakness Endocrine: negative for diabetic symptoms including polydipsia, polyuria and skin dryness Allergic/Immunologic: negative for hay fever and urticaria      Objective:  There were no vitals taken for this visit. There is no height or weight on file to calculate BMI.    General Appearance:    Alert, cooperative, no distress, appears stated age  Head:    Normocephalic, without obvious abnormality, atraumatic  Eyes:    PERRL, conjunctiva/corneas clear, EOMs intact bilaterally  Ears:    Normal external ear canals bilaterally  Nose:   Nares normal  Throat:   Lips, mucosa, and tongue normal; teeth and gums normal  Neck:   Supple, symmetrical, trachea midline, no adenopathy; thyroid : no enlargement/tenderness/nodules; no carotid bruit or JVD  Back:     ROM normal, no CVA tenderness  Lungs:     Clear to auscultation bilaterally, respirations unlabored  Chest Wall:    No tenderness or deformity   Heart:    Regular rate and rhythm, S1 and S2 normal, no  appreciated murmur, rub or gallop  Breast Exam:    No tenderness, masses, or nipple abnormality; no skin retraction, dimpling or nipple discharge.  Abdomen:     Soft, non-tender, bowel sounds active all four quadrants, no masses, no organomegaly.    Genitalia:    Pelvic:external genitalia normal, vagina without lesions, discharge, or tenderness, rectovaginal septum  normal. Cervix normal in appearance, no cervical motion tenderness, no adnexal masses or tenderness.  Uterus normal size, shape, mobile, regular contours, nontender.  Rectal:    Normal external sphincter.  No hemorrhoids appreciated. Internal exam not done.   Extremities:   Extremities normal, atraumatic, no cyanosis or edema  Pulses:   2+ and symmetric all extremities  Skin:   Skin color, texture, turgor normal, no rashes or lesions  Lymph nodes:   Cervical, supraclavicular, and axillary nodes normal  Neurologic:   CNII-XII intact, normal strength, sensation and reflexes  throughout   .  Labs:  Lab Results  Component Value Date   WBC 6.1 12/04/2022   HGB 12.2 12/04/2022   HCT 37.3 12/04/2022   MCV 86 12/04/2022   PLT 317 12/04/2022    Lab Results  Component Value Date   CREATININE 0.82 12/04/2022   BUN 12 12/04/2022   NA 139 12/04/2022   K 4.2 12/04/2022   CL 105 12/04/2022   CO2 22 12/04/2022    Lab Results  Component Value Date   ALT 10 12/04/2022   AST 17 12/04/2022   ALKPHOS 69 12/04/2022   BILITOT 0.4 12/04/2022    Lab Results  Component Value Date   TSH 0.989 11/22/2020     Assessment:   No diagnosis found.   Plan:   Anderson Anderson is a 23 y.o. G65P1001 female here today for her annual exam, doing well.  -Screenings:  Pap: done with cotesting today  *** w/rflx today Mammogram: ordered***due *** Labs: ***A1C, CMP, HepC, Lipid panel, Vit D, TSH PHQ-2 = 0,    = ___, discussed coping techniques; RTC if worsens or develops concern -Contraception: *** -Vaccines: UTD, Tdap today; pt declines  Influenza. Gardasil: series not started, declined today.  -Healthy lifestyle modifications discussed: multivitamin, diet, exercise, sunscreen. Emphasized importance of regular physical activity.  -Folic Acid  recommendation reviewed.  -All questions answered to patient's satisfaction.  -RTC 1 yr for annual, sooner prn.    Rian Chafe, CMA [facility]

## 2023-12-27 NOTE — Patient Instructions (Signed)

## 2023-12-29 ENCOUNTER — Other Ambulatory Visit (HOSPITAL_COMMUNITY)
Admission: RE | Admit: 2023-12-29 | Discharge: 2023-12-29 | Disposition: A | Discharge status: Home / Self Care | Source: Ambulatory Visit | Attending: Obstetrics | Admitting: Obstetrics

## 2023-12-29 ENCOUNTER — Encounter: Payer: Self-pay | Admitting: Obstetrics

## 2023-12-29 ENCOUNTER — Ambulatory Visit (INDEPENDENT_AMBULATORY_CARE_PROVIDER_SITE_OTHER): Admitting: Obstetrics

## 2023-12-29 VITALS — BP 112/65 | HR 89 | Ht 69.0 in | Wt 262.0 lb

## 2023-12-29 DIAGNOSIS — Z01419 Encounter for gynecological examination (general) (routine) without abnormal findings: Secondary | ICD-10-CM

## 2023-12-29 DIAGNOSIS — Z3169 Encounter for other general counseling and advice on procreation: Secondary | ICD-10-CM | POA: Diagnosis not present

## 2023-12-29 DIAGNOSIS — Z113 Encounter for screening for infections with a predominantly sexual mode of transmission: Secondary | ICD-10-CM | POA: Insufficient documentation

## 2023-12-29 DIAGNOSIS — Z124 Encounter for screening for malignant neoplasm of cervix: Secondary | ICD-10-CM | POA: Diagnosis not present

## 2023-12-30 DIAGNOSIS — Z419 Encounter for procedure for purposes other than remedying health state, unspecified: Secondary | ICD-10-CM | POA: Diagnosis not present

## 2023-12-30 LAB — HEP, RPR, HIV PANEL
HIV Screen 4th Generation wRfx: NONREACTIVE
Hepatitis B Surface Ag: NEGATIVE
RPR Ser Ql: NONREACTIVE

## 2023-12-30 LAB — CYTOLOGY - PAP
Chlamydia: NEGATIVE
Comment: NEGATIVE
Comment: NEGATIVE
Comment: NORMAL
Diagnosis: NEGATIVE
Neisseria Gonorrhea: NEGATIVE
Trichomonas: NEGATIVE

## 2024-01-29 DIAGNOSIS — Z419 Encounter for procedure for purposes other than remedying health state, unspecified: Secondary | ICD-10-CM | POA: Diagnosis not present

## 2024-02-29 DIAGNOSIS — Z419 Encounter for procedure for purposes other than remedying health state, unspecified: Secondary | ICD-10-CM | POA: Diagnosis not present

## 2024-03-31 DIAGNOSIS — Z419 Encounter for procedure for purposes other than remedying health state, unspecified: Secondary | ICD-10-CM | POA: Diagnosis not present

## 2024-04-07 DIAGNOSIS — R7303 Prediabetes: Secondary | ICD-10-CM | POA: Diagnosis not present

## 2024-04-07 DIAGNOSIS — Z8249 Family history of ischemic heart disease and other diseases of the circulatory system: Secondary | ICD-10-CM | POA: Diagnosis not present

## 2024-04-07 DIAGNOSIS — Z833 Family history of diabetes mellitus: Secondary | ICD-10-CM | POA: Diagnosis not present

## 2024-04-07 DIAGNOSIS — E669 Obesity, unspecified: Secondary | ICD-10-CM | POA: Diagnosis not present

## 2024-04-07 DIAGNOSIS — N946 Dysmenorrhea, unspecified: Secondary | ICD-10-CM | POA: Diagnosis not present

## 2024-04-07 DIAGNOSIS — F411 Generalized anxiety disorder: Secondary | ICD-10-CM | POA: Diagnosis not present

## 2024-04-07 DIAGNOSIS — Z6837 Body mass index (BMI) 37.0-37.9, adult: Secondary | ICD-10-CM | POA: Diagnosis not present

## 2024-04-07 DIAGNOSIS — J45909 Unspecified asthma, uncomplicated: Secondary | ICD-10-CM | POA: Diagnosis not present

## 2024-05-02 DIAGNOSIS — N912 Amenorrhea, unspecified: Secondary | ICD-10-CM | POA: Diagnosis not present

## 2024-06-06 DIAGNOSIS — Z03818 Encounter for observation for suspected exposure to other biological agents ruled out: Secondary | ICD-10-CM | POA: Diagnosis not present

## 2024-06-06 DIAGNOSIS — R21 Rash and other nonspecific skin eruption: Secondary | ICD-10-CM | POA: Diagnosis not present

## 2024-06-29 DIAGNOSIS — Z0000 Encounter for general adult medical examination without abnormal findings: Secondary | ICD-10-CM | POA: Diagnosis not present

## 2024-07-06 DIAGNOSIS — M25442 Effusion, left hand: Secondary | ICD-10-CM | POA: Diagnosis not present
